# Patient Record
Sex: Male | Born: 1935 | Race: White | Hispanic: No | Marital: Married
Health system: Midwestern US, Academic
[De-identification: ages and names within clinical notes are randomized; demographics above are authoritative.]

---

## 2017-04-22 ENCOUNTER — Encounter: Admit: 2017-04-22 | Discharge: 2017-04-23 | Payer: MEDICARE

## 2017-04-22 ENCOUNTER — Encounter: Admit: 2017-04-22 | Discharge: 2017-04-22 | Payer: MEDICARE

## 2017-04-24 ENCOUNTER — Encounter: Admit: 2017-04-24 | Discharge: 2017-04-25 | Payer: MEDICARE

## 2017-04-30 ENCOUNTER — Inpatient Hospital Stay: Admit: 2017-04-30 | Discharge: 2017-05-05 | Disposition: A | Discharge status: Rehabilitation Facility | Payer: MEDICARE

## 2017-04-30 ENCOUNTER — Encounter: Admit: 2017-04-30 | Discharge: 2017-04-30 | Payer: MEDICARE

## 2017-04-30 DIAGNOSIS — R69 Illness, unspecified: ICD-10-CM

## 2017-04-30 DIAGNOSIS — I96 Gangrene, not elsewhere classified: ICD-10-CM

## 2017-04-30 DIAGNOSIS — Z9889 Other specified postprocedural states: ICD-10-CM

## 2017-04-30 DIAGNOSIS — I82409 Acute embolism and thrombosis of unspecified deep veins of unspecified lower extremity: ICD-10-CM

## 2017-04-30 DIAGNOSIS — E785 Hyperlipidemia, unspecified: Principal | ICD-10-CM

## 2017-04-30 DIAGNOSIS — I739 Peripheral vascular disease, unspecified: ICD-10-CM

## 2017-04-30 LAB — CBC AND DIFF
Lab: 0 10*3/uL (ref 0–0.20)
Lab: 0.4 10*3/uL (ref 0–0.45)
Lab: 15 10*3/uL — ABNORMAL HIGH (ref 4.5–11.0)

## 2017-04-30 LAB — COMPREHENSIVE METABOLIC PANEL
Lab: 136 MMOL/L — ABNORMAL LOW (ref 137–147)
Lab: 21 mL/min — ABNORMAL LOW (ref >60)
Lab: 4.7 MMOL/L — ABNORMAL LOW (ref 3.5–5.1)
Lab: 99 MMOL/L — ABNORMAL LOW (ref 98–110)

## 2017-04-30 LAB — POC LACTATE: Lab: 2 MMOL/L (ref 0.5–2.0)

## 2017-04-30 MED ORDER — VANCOMYCIN 1,250 MG IVPB
15 mg/kg | Freq: Once | INTRAVENOUS | 0 refills | Status: CP
Start: 2017-04-30 — End: ?
  Administered 2017-04-30 (×2): 1250 mg via INTRAVENOUS

## 2017-04-30 MED ORDER — HEPARIN (PORCINE) IN 5 % DEX 20,000 UNIT/500 ML (40 UNIT/ML) IV SOLP
0-2000 [IU]/h | INTRAVENOUS | 0 refills | Status: DC
Start: 2017-04-30 — End: 2017-05-05
  Administered 2017-05-01 – 2017-05-05 (×8): 1311 [IU]/h via INTRAVENOUS

## 2017-04-30 MED ORDER — LEVOFLOXACIN IN D5W 750 MG/150 ML IV PGBK
750 mg | Freq: Once | INTRAVENOUS | 0 refills | Status: CP
Start: 2017-04-30 — End: ?
  Administered 2017-05-01: 01:00:00 750 mg via INTRAVENOUS

## 2017-04-30 MED ORDER — CEFEPIME IN DEXTROSE,ISO-OSM 2 GRAM/100 ML IV PGBK
2 g | Freq: Two times a day (BID) | INTRAVENOUS | 0 refills | Status: DC
Start: 2017-04-30 — End: 2017-05-01

## 2017-04-30 MED ORDER — VANCOMYCIN PHARMACY TO MANAGE
1 | 0 refills | Status: DC
Start: 2017-04-30 — End: 2017-05-01

## 2017-04-30 MED ORDER — LACTATED RINGERS IV SOLP
1000 mL | INTRAVENOUS | 0 refills | Status: CP
Start: 2017-04-30 — End: ?
  Administered 2017-05-01: 03:00:00 1000 mL via INTRAVENOUS

## 2017-04-30 MED ORDER — LACTATED RINGERS IV SOLP
250 mL | INTRAVENOUS | 0 refills | Status: CP
Start: 2017-04-30 — End: ?
  Administered 2017-04-30: 23:00:00 250 mL via INTRAVENOUS

## 2017-04-30 MED ORDER — METOPROLOL SUCCINATE 50 MG PO TB24
50 mg | Freq: Every day | ORAL | 0 refills | Status: CN
Start: 2017-04-30 — End: ?

## 2017-04-30 MED ORDER — APIXABAN 5 MG PO TAB
5 mg | Freq: Two times a day (BID) | ORAL | 0 refills | Status: DC
Start: 2017-04-30 — End: 2017-05-01

## 2017-04-30 MED ORDER — VANCOMYCIN 1,250 MG IVPB
15 mg/kg | Freq: Two times a day (BID) | INTRAVENOUS | 0 refills | Status: DC
Start: 2017-04-30 — End: 2017-05-01

## 2017-04-30 MED ORDER — ATORVASTATIN 40 MG PO TAB
40 mg | Freq: Every day | ORAL | 0 refills | Status: DC
Start: 2017-04-30 — End: 2017-05-05
  Administered 2017-05-01 – 2017-05-05 (×5): 40 mg via ORAL

## 2017-04-30 MED ORDER — HEPARIN (PORCINE) BOLUS FOR CONTINUOUS INF (BAG)
20-40 [IU]/kg | INTRAVENOUS | 0 refills | Status: DC
Start: 2017-04-30 — End: 2017-05-05

## 2017-04-30 MED ORDER — CEFEPIME IN DEXTROSE,ISO-OSM 2 GRAM/100 ML IV PGBK
2 g | INTRAVENOUS | 0 refills | Status: DC
Start: 2017-04-30 — End: 2017-05-01

## 2017-04-30 MED ORDER — VANCOMYCIN RANDOM DOSING
1 | INTRAVENOUS | 0 refills | Status: DC
Start: 2017-04-30 — End: 2017-05-01

## 2017-04-30 MED ORDER — ASPIRIN 325 MG PO TAB
325 mg | Freq: Every day | ORAL | 0 refills | Status: DC
Start: 2017-04-30 — End: 2017-05-05
  Administered 2017-05-01 – 2017-05-05 (×5): 325 mg via ORAL

## 2017-04-30 MED ORDER — LACTATED RINGERS IV SOLP
1000 mL | Freq: Once | INTRAVENOUS | 0 refills | Status: CP
Start: 2017-04-30 — End: ?
  Administered 2017-05-01: 11:00:00 1000 mL via INTRAVENOUS

## 2017-04-30 MED ORDER — SIMVASTATIN 20 MG PO TAB
80 mg | Freq: Every evening | ORAL | 0 refills | Status: DC
Start: 2017-04-30 — End: 2017-05-01

## 2017-04-30 MED ORDER — HEPARIN (PORCINE) BOLUS FOR CONTINUOUS INF (BAG)
70 [IU]/kg | Freq: Once | INTRAVENOUS | 0 refills | Status: CP
Start: 2017-04-30 — End: ?

## 2017-04-30 NOTE — ED Notes
Pt to US with transport

## 2017-04-30 NOTE — ED Notes
Pt is a 83 y.o. male who presents to the ED for a CC of possible necrotic 1st and 4th toes on left foot. Pt also states that MD believes he has decreased renal function. Pt has a past medical history of DVT (deep venous thrombosis) (HCC) (12/28/2014); Dyslipidemia (12/28/2014); History of CEA (carotid endarterectomy) (12/28/2014); and PVD (peripheral vascular disease) (HCC) (12/28/2014).   Pt states that a few weeks ago he had DVT and during removal he became paralyzed from the waist down and had to go to rehab. Pt formed an ulcer on the left foot and was wearing a soft boot. Pt then states when he was doing PT today at rehab they hit his foot and it started to bleed. When RN went to stop the bleeding, they noticed the black on the foot. Pt was sent to ER for evaluation of possible amputation. Pt very tearful at bedside. Denies fevers, chills or pain.   Pt AA&Ox4; VSS; NAD; Skin pwd.  Call light within reach, bed in lowest and locked position; oriented to surroundings  Await MD eval.

## 2017-04-30 NOTE — Consults
[facility] Consult Note  04/30/2017     Patient: Justin Hunter  MRN: [id number]    Admission Date:  04/30/2017, LOS: 0 days  Admission Diagnosis: No admission diagnoses are documented for this encounter.  Reason for Consult: toe discoloration    ASSESSMENT: 83 y.o. 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 Hollie Beach  __________________________________________________________________________________    HPI: Justin Hunter is a 83 y.o. male with a history of PVD. He recently underwent a LLE bypass at OSH. Procedure was complicated by need for thrombolysis post-op. 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 He has been at rehab since that time - approx 1.5 weeks. He was sent to [facility] ED due to rising creatinine. He has had discoloration of his toes on his left foot for some time and vascular surgery was asked to evaluate. Currently denies pain.     Past Medical History:   Diagnosis Date   ??? DVT (deep venous thrombosis) (HCC) 12/28/2014   ??? Dyslipidemia 12/28/2014   ??? History of CEA (carotid endarterectomy) 12/28/2014   ??? PVD (peripheral vascular disease) (HCC) 12/28/2014     History reviewed. No pertinent surgical history.  Family History   Problem Relation Age of Onset   ??? Cirrhosis Mother    ??? Heart Attack Father      Social History     Social History   ??? Marital status: Married     Spouse name: N/A   ??? Number of children: N/A   ??? Years of education: N/A     Occupational History   ??? Not on file.     Social History Main Topics   ??? Smoking status: Former Smoker     Packs/day: 1.00     Years: 30.00     Types: Cigarettes     Quit date: 06/29/2014   ??? Smokeless tobacco: Never Used   ??? Alcohol use 1.2 oz/week 1 Glasses of wine, 1 Cans of beer per week      Comment: occasionally   ??? Drug use: No   ??? Sexual activity: Not on file     Other Topics Concern   ??? Not on file     Social History Narrative   ??? No narrative on file       Medications:  No current facility-administered medications on file prior to encounter.      Current Outpatient Prescriptions on File Prior to Encounter   Medication Sig Dispense Refill   ??? aspirin 325 mg tablet Take 325 mg by mouth daily.     ??? lisinopril (PRINIVIL; ZESTRIL) 20 mg tablet Take 20 mg by mouth daily.     ??? metFORMIN (GLUCOPHAGE) 500 mg tablet Take 500 mg by mouth twice daily with meals.     ??? metoprolol XL (TOPROL XL) 50 mg tablet Take 50 mg by mouth daily.     ??? simvastatin (ZOCOR) 80 mg tablet Take 80 mg by mouth at bedtime daily.     ??? sucralfate (CARAFATE) 1 gram tablet Take 1 g by mouth every 6 hours.     ??? warfarin (COUMADIN) 5 mg tablet Take 5 mg by mouth daily. Daily except Sunday and Tuesday take 2.5mg   Allergies:  Niacin    Vitals:  Vital Signs: Last Filed In 24 Hours Vital Signs: 24 Hour Range   BP: 104/65 (09/26 1350)  Temp: 36.4 ???C (97.5 ???F) (09/26 1350)  Pulse: 94 (09/26 1350)  Respirations: 18 PER MINUTE (09/26 1350)  SpO2: 96 % (09/26 1350)  O2 Delivery: None (Room Air) (09/26 1350)  Height: 172.7 cm (68) (09/26 1350) BP: (104)/(65)   Temp:  [36.4 ???C (97.5 ???F)]   Pulse:  [94]   Respirations:  [18 PER MINUTE]   SpO2:  [96 %]   O2 Delivery: None (Room Air)          Intake/Output:  No intake or output data in the 24 hours ending 04/30/17 1656  Physical Exam:   GEN:  Well nourished, A&O, NAD  HEENT:  EOMI, no scleral icterus  RESP:  Unlabored respirations  CV:  Regular rate  ABD: Soft, NT, ND,   EXT:    Right - biphasic DP/PT  Left - Monophasic DP/PT. Strong monophasic distal AT at ankle.   SKIN: No jaundice or pallor  NEURO: b/l LE weakness    ROS:  A complete 12 point ROS was obtained and was negative except for those listed in HPI. Lab/Radiology/Other Diagnostic Tests:  Recent Labs      04/30/17   1400   HGB  11.1*   HCT  32.6*   WBC  15.4*   PLTCT  299   NA  136*   K  4.7   CL  99   CO2  23   BUN  99*   CR  3.46*   GLU  157*   CA  9.1   ALBUMIN  3.7   TOTPROT  7.3   TOTBILI  0.8   AST  57*   ALT  40   ALKPHOS  75     Glucose: (!) 157 (04/30/17 1400)    US DOPPLER ART LE LEFT   Final Result         1.  Extensive arterial vascular disease within the left lower extremity with occluded superficial femoral, popliteal, posterior tibial and dorsalis pedis arteries.   2.  Patent graft extending from the left common femoral artery to the high calf with patent anterior tibial artery.   3.  Short segment occluded graft in the region of the mid posterior tibial artery.   4.  Minute flow within the peroneal artery with severe arterial insufficiency.          Approved by Claudina Lick, M.D. on 04/30/2017 4:26 PM      By my electronic signature, I attest that I have personally reviewed the images for this examination and formulated the interpretations and opinions expressed in this report          Finalized by Arlana Hove, M.D. on 04/30/2017 4:28 PM. Dictated by Claudina Lick, M.D. on 04/30/2017 4:09 PM.               Durwin Reges, MD  [phone]

## 2017-04-30 NOTE — ED Notes
Medical Records Authorization signed and faxed to [facility].

## 2017-05-01 LAB — PTT (APTT)
Lab: 26 s — ABNORMAL LOW (ref >60)
Lab: 90 s — ABNORMAL HIGH (ref >60)
Lab: 82 s — ABNORMAL HIGH (ref 20.0–36.0)

## 2017-05-01 LAB — GRAM STAIN

## 2017-05-01 LAB — URINALYSIS DIPSTICK
Lab: NEGATIVE U/L (ref 7–56)
Lab: NEGATIVE U/L — ABNORMAL LOW (ref 25–110)
Lab: NEGATIVE g/dL — ABNORMAL HIGH (ref 3.5–5.0)
Lab: NEGATIVE mg/dL (ref 0.3–1.2)

## 2017-05-01 LAB — URINALYSIS, MICROSCOPIC

## 2017-05-01 LAB — CULTURE-URINE W/SENSITIVITY

## 2017-05-01 LAB — SODIUM-URINE RANDOM
Lab: 58 MMOL/L
Lab: 58 MMOL/L

## 2017-05-01 LAB — SED RATE: Lab: 30 mm/h — ABNORMAL HIGH (ref 0–20)

## 2017-05-01 LAB — C REACTIVE PROTEIN (CRP): Lab: 9.4 mg/dL — ABNORMAL HIGH (ref <1.0)

## 2017-05-01 LAB — CBC AND DIFF
Lab: 8.7 K/UL — ABNORMAL LOW (ref 4.5–11.0)
Lab: 9.1 g/dL — ABNORMAL LOW (ref >60)

## 2017-05-01 LAB — OSMOLALITY-URINE RANDOM: Lab: 514 mosm/kg (ref 50–1400)

## 2017-05-01 LAB — CREATININE-URINE RANDOM: Lab: 104 mg/dL

## 2017-05-01 LAB — BASIC METABOLIC PANEL: Lab: 135 MMOL/L — ABNORMAL LOW (ref >60)

## 2017-05-01 MED ORDER — SENNOSIDES-DOCUSATE SODIUM 8.6-50 MG PO TAB
2 | Freq: Two times a day (BID) | ORAL | 0 refills | Status: DC
Start: 2017-05-01 — End: 2017-05-03
  Administered 2017-05-01: 20:00:00 2 via ORAL
  Administered 2017-05-02: 02:00:00 1 via ORAL
  Administered 2017-05-02: 15:00:00 2 via ORAL

## 2017-05-01 MED ORDER — POLYETHYLENE GLYCOL 3350 17 GRAM PO PWPK
1 | Freq: Every day | ORAL | 0 refills | Status: DC
Start: 2017-05-01 — End: 2017-05-05
  Administered 2017-05-01 – 2017-05-05 (×4): 17 g via ORAL

## 2017-05-01 MED ORDER — BISACODYL 10 MG RE SUPP
10 mg | Freq: Every day | RECTAL | 0 refills | Status: DC
Start: 2017-05-01 — End: 2017-05-03
  Administered 2017-05-01: 20:00:00 10 mg via RECTAL

## 2017-05-01 NOTE — ED Notes
BH [phone] (ready) please call Debbie at [phone]

## 2017-05-01 NOTE — Progress Notes
Pharmacy Vancomycin Note  Subjective:   Justin Hunter is a 83 y.o. male being treated for Concern for Osteomyelitis.    Objective:     Current Vancomycin Orders   Medication Dose Route Frequency    vancomycin, pharmacy to manage  1 each Service Per Pharmacy    vancomycin, random dosing  1 each Intravenous Random Dosing     Start Date of  Vancomycin therapy: 04/30/2017  Additional Abx: cefepime  Cultures:  ,  ,  ,    White Blood Cells   Date/Time Value Ref Range Status   04/30/2017 1400 15.4 (H) 4.5 - 11.0 K/UL Final     Creatinine   Date/Time Value Ref Range Status   04/30/2017 1400 3.46 (H) 0.4 - 1.24 MG/DL Final     Blood Urea Nitrogen   Date/Time Value Ref Range Status   04/30/2017 1400 99 (H) 7 - 25 MG/DL Final     Estimated CrCl: 17.8    Intake/Output Summary (Last 24 hours) at 04/30/17 2151  Last data filed at 04/30/17 1940   Gross per 24 hour   Intake              525 ml   Output                0 ml   Net              525 ml      Actual Weight:  87.4 kg (192 lb 10.9 oz)  Dosing BW:  88 kg     Assessment:   Target levels for this patient: 15-20.    Plan:   1. Patient recieved vancomycin 1250mg  IV x1 9/26 PM.  Will do random dosing given renal fxn.  2. Next scheduled level(s): day rph to determine  3. Pharmacy will continue to monitor and adjust therapy as needed.    Arneta Cliche, [facility]  04/30/2017   [phone]

## 2017-05-01 NOTE — Case Management (ED)
Case Management Admission Assessment    NAME:Justin Hunter                          MRN: [id number]             DOB:29-Dec-1932          AGE: 83 y.o.  ADMISSION DATE: 04/30/2017             DAYS ADMITTED: LOS: 1 day      Today???s Date: 05/01/2017    Source of Information: Pt and wife     Plan: ???  Justin Hunter is an 83 y.o. male with a past medical history of peripheral vascular disease status post multiple left lower extremity interventions, dry gangrene of the lower extremity left toe, spinal thoracic stroke who presents to the ED from his rehab facility after being noted to have progression of his left lower extremity toe dry gangrene with a worsening of the renal function.    Anticipate d.c back to [facility] when medically stable. SW will arrange transport back to facility     -Pt lives with wife in a single wife trailer, but was admitted from [facility]   -Before recent medical issues and placement at [facility], pt was independent in ALDs and self care tasks   -Pt has a shower seat but no other DME. [facility] will need to assist pt with obtaining appropriate DME after [facility] placement  -Pt has Medicare and BCBS. Pt does not have a perscription plan and states they pay out of pocket for medicaitons. [facility] is [facility] and states he used to get mediations from [place] but hasn't utilized the [place] for medications for awhile.     Patient Address/Phone  [address]  [place] [address]  [phone] (home)     Emergency Contact  Extended Emergency Contact Information  Primary Emergency Contact: Theodoro [place]ist [place]  Home Phone: [phone]  Relation: None  Secondary Emergency Contact: Delpizzo,Joan   [place]  Home Phone: [phone]  Relation: Spouse    Healthcare Directive  Healthcare Directive: Yes, patient has a healthcare directive  Type of Healthcare Directive: Healthcare directive, Living Will  Would patient like to fill out a (a new) Healthcare Directive?: No, patient declined      Transportation Does the patient need discharge transport arranged?: Yes  Transportation Name, Phone and Availability #1: Facility transport     Expected Discharge Date  Expected Discharge Date: 05/03/17    Living Situation Prior to Admission  ? Living Arrangements  Type of Residence: Acute care facility  Living Arrangements: Spouse/significant other  Financial risk analyst / Tub: Walk-in Shower  How many levels in the residence?: 1  Can patient live on one level if needed?: Yes  Does residence have entry and/or side stairs?: Yes (6 steps )  Assistance needed prior to admit or anticipated on discharge: Yes  Who provides assistance or could if needed?: Facility staf/wife   Are they in good health?: Unknown  Can support system provide 24/7 care if needed?: Maybe  ? Level of Function   Prior level of function: Needs assist with ADLs  Which ADLs require assistance?: All   Who assists with ADLs?: Facility staff   ? Cognitive Abilities   Cognitive Abilities: Alert and Oriented, Participates in decision making, Engages in problem solving and planning    Financial Resources  ? Coverage  Primary Insurance: Medicare  Secondary Insurance: Commercial insurance    ?  Source of Income   Source Of Income: Other retirement income, SSI  ? Financial Assistance Needed?  N/A    Psychosocial Needs  ? Mental Health  Mental Health History: No  ? Substance Use History  Substance Use History Screen: No  ? Other  N/A    Current/Previous Services  ? PCP  Dalbert Batman, [phone], [phone]  ? Pharmacy  No Pharmacies Listed  ? Durable Medical Equipment   Durable Medical Equipment at home: Shower Chair  ? Home Health  Receiving home health: In the past  Agency name: [facility]   Would patient use this agency again?: Yes  ? Hemodialysis or Peritoneal Dialysis  Undergoing hemodialysis or peritoneal dialysis: No  ? Tube/Enteral Feeds  Receive tube/enteral feeds: No  ? Infusion  Receive infusions: No  ? Private Duty  Private duty help used: No ? Home and Community Based Services  Home and community based services: No  ? Ryan White  Ryan White: No  ? Hospice  Hospice: No  ? Outpatient Therapy  PT: No  OT: No  SLP: No  ? Skilled Nursing Facility/Nursing Home  SNF: No  NH: No  ? Inpatient Rehab  IPR: Yes  When did patient receive care?: Current  Name of Facility: [facility]   Would patient return for future services?: Yes  ? Long-Term Acute Care Hospital  LTACH: No  ? Acute Hospital Stay  Acute Hospital Stay: In the past

## 2017-05-01 NOTE — Case Management (ED)
Case Management Progress Note    NAME:Justin Hunter                          MRN: [id number]              DOB:[birth year]          AGE: 83 y.o.  ADMISSION DATE: 04/30/2017             DAYS ADMITTED: LOS: 1 day      Todays Date: 05/01/2017    Plan: Anticipate d.c back to [facility] when medically stable    PT recommending inpatient setting  Wound following-recommendations:  Left toes- Dry gauze spacers between toes, Aquacel Ag loosely around 4th toes to absorb drainage, change daily. May use stretch netting to keep gauze in place   Buttocks- Criticaid barrier cream BID and PRN   Q 2 hour turning schedule, use foam wedge for support   No brief while in bed/ single chux layer     Surgery Vascular following-recommendations:  -No acute surgical intervention  -Need to obtain surgical records from OSH  -Recommend medicine admission for management of AKI  -Will follow     Interventions  ? Support      ? Info or Referral      ? Discharge Planning   SW received message from [facility] liaison updating that came came from facility and they able to accept back when medically stable   SW sent updates to [facility]   ? Medication Needs      ? Financial      ? Legal      ? Other        Disposition  ? Expected Discharge Date       ? Transportation      ? Next Level of Care (Acute Psych discharges only)      ? Discharge Disposition    Durable Medical Equipment     No service has been selected for the patient.      [facility]     No service has been selected for the patient.      [facility]     No service has been selected for the patient.      [facility]     No service has been selected for the patient.

## 2017-05-01 NOTE — Patient Education
Medication Education    Justin Hunter accepted counseling and was engaged.  he verbalized understanding.    The following medications were discussed:  Heparin drip  LR fluids  Eliquis    Where indicated, the patient was provided with additional medication and/or disease-state information, including pressure injury prevention, foley care, SCD's.  All patient questions were answered and patient acknowledged understanding of the medications, side effects and other pertinent medication information.    Follow up should occur as needed.    Continue to address: indications    Tawny Hopping, RN

## 2017-05-01 NOTE — Progress Notes
PHYSICAL THERAPY  ASSESSMENT     MOBILITY:  Mobility  Progressive Mobility Level: Sit on edge of bed  Level of Assistance: Assist X1  Assistive Device: None  Time Tolerated: 11-30 minutes  Activity Limited By: Fatigue    SUBJECTIVE:  Subjective  Significant hospital events: Justin Hunter is a 83 y.o. male with a history of PVD. He underwent a LLE bypass at OSH 9/17. Procedure was complicated by need for thrombolysis post-op. 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 He has been at rehab approx 2 days. He was sent to [facility] ED due to rising creatinine. He has had discoloration of his toes on his left foot for some time and vascular surgery was asked to evaluate.  Mental / Cognitive Status: Alert;Cooperative;Follows Commands  Persons Present: Daughter;Spouse;Nursing Staff;Provider  Pain: Patient has no complaint of pain  Comments: Pt was independent prior to surgery. Pt reports at rehab they had been transferring him via hoyer to an electric w/c and he had done some leg exercises. Pt had not been wearing a solid PRAFO since procedure.    ROM:  ROM  LE ROM WFL: Yes    STRENGTH:  Strength  R LE WNL: No  R Hip Flexion: 0/5  R Hip Extension: 0/5  R Hip Abduction: 0/5  R Hip Adduction: 0/5  R Hip External Rotation: 0/5  R Hip Internal Rotation: 0/5  R Knee Flexion: 0/5  R Knee Extension: 0/5  R Ankle Dorsiflexion: 0/5  R Ankle Inversion: 0/5  R Ankle Eversion: 0/5  R Ankle Plantarflexion: 0/5  R Great Toe Extension: 0/5  R Great Toe Flexion: 0/5  L LE WNL: No  L Hip Flexion: 2-/5  L Hip Extension: 2-/5  L Hip Abduction: 2+/5  L Hip Adduction: 2+/5  L Hip External Rotation: 2/5  L Hip Internal Rotation: 2/5  L Knee Flexion: 2-/5  L Knee Extension: 3-/5  L Ankle Dorsiflexion: 0/5  L Ankle Inversion: 0/5  L Ankle Eversion: 0/5  L Ankle Plantarflexion: 2/5  L Great Toe Extension: 0/5  L Great Toe Flexion: 0/5  L UE WNL: Yes Strength Comment: Pt has previous shoulder problems limiting ability to resist during muscle testing.    POSTURE/NEURO:  Posture / Neurological  LLE Sensation/Proprioception: Impaired Light Touch  RLE Sensation/Proprioception: Impaired Light Touch  Posture/Neuro Comments: Sensation intact throughout torso, becomes diminished at hips. Patchy sensation present throughout LLE, none on RLE. Pt reports BLE tingling following PT session.    BED MOBILITY/TRANSFERS:  Bed Mobility/Transfers  Bed Mobility: Rolling: Moderate Assist;Verbal Cues;Bed Flat;Use of Rail;Assist with B LE  Bed Mobility: Supine to Sit: Maximum Assist;Verbal Cues;Bed Flat;Use of Rail;Assist with B LE;Assist with Trunk  Bed Mobility: Sit to Supine: Maximum Assist;Verbal Cues;Bed Flat;Use of Rail;Safety Considerations;Assist with B LE;Assist with Trunk  Comments: Pt rolled to right side and sat from side lying position on R.  End Of Activity Status: In Bed;Nursing Notified;Instructed Patient to Request Assist with Mobility;Instructed Patient to Use Call Light (chair mode)    BALANCE:  Balance  Sitting Balance: Static Sitting Balance;2 UE Support;Standby Assist;Minimal Assist;Dynamic Sitting Balance;Moderate Assist    GAIT:  Gait  Comments: Not indicated at this time.    ACTIVITY/EXERCISE:  Activity / Exercise  Sit Edge Of Bed: 20 minutes  Sit Edge Of Bed Assist: Variable (progressed from ModA to SBA-CGA)  Exercise: Seated;LLE (LAQ)  Comments: Pt completed reps trunk rotation, varying hand placement, placing hands on thighs  and correcting balance, contralateral reaching, lateral leaning to forearm and recovery and other static and dynamic sitting balance activities. Pt progressed from ModA due to right and retrograde lean to close SBA.    EDUCATION:  Education  Persons Educated: Patient/Family  Patient Barriers To Learning: Decreased Hearing  Interventions: Repetition of Instructions;Louder Voice Required  Teaching Methods: Verbal Instruction Patient Response: Verbalized Understanding  Topics: Plan/Goals of PT Interventions;Mobility Progression    ASSESSMENT/PROGRESS:  Assessment/Progress  Impaired Mobility Due To: Decreased Strength;Post Surgical Changes;Impaired Balance  Assessment/Progress: Should Improve w/ Continued PT  AM-PAC 6 Clicks Basic Mobility Inpatient  Turning from your back to your side while in a flat bed without using bed rails: A lot  Moving from lying on your back to sitting on the side of a flatbed without using bedrails : A Lot  Moving to and from a bed to a chair (including a wheelchair): Total  Standing up from a chair using your arms (e.g. wheelchair, or bedside chair): Total  To walk in hospital room: Total  Climbing 3-5 steps with a railing: Total  Raw Score: 8  Standardized (T-scale) Score: 22.61  Basic Mobility CMS 0-100%: 85.35  CMS G Code Modifier for Basic Mobility: CM     G-Codes: Mobility  [id number] Current Status: 80-99% Impairment   [id number] Goal Status: 40-59% Impairment     Based on above evaluation and clinical judgment.    GOALS:  Goals  Goal Formulation: With Patient/Family  Time For Goal Achievement: 7 days  Pt Will Go Supine To/From Sit: w/ Minimal Assist  Pt Will Transfer Bed/Chair: w/ Moderate Assist  Pt Will Transfer Sit to Stand: w/ Maximum Assist  Pt Will Achieve Sitting Balance: Standby Assist  Patientt Will Sit Edge Of Bed: 11-15 Minutes, w/ Stand By Assist    PLAN:  Plan   Treatment Interventions: Mobility Training;Strengthening;Balance Activities  Plan Frequency: 5-7 Days per Week  PT Plan for Next Visit: *progress pt assist with rolling and bed mobility *sitting balance *LE strengthening *trial slide board once sitting balance improves     RECOMMENDATIONS:  PT Discharge Recommendations  PT Discharge Recommendations: Inpatient Setting;Recommend Physical Medicine and Rehabilitation Consult to address most appropriate level of rehabilitation placement  Equipment Recommendations: Too early to be determined Therapist: Trilby Leaver, PT  Date: 05/01/2017

## 2017-05-01 NOTE — Progress Notes
ORTHOTICS/PROSTHETICS  Consult Note:      NAME: Justin Hunter  ADMISSION DATE: admitted to hospital  DOB: November 08, 1932  AGE: 83 y.o.  ROOM: [id number]  DOCTOR:     Date of Order: 05/01/17  Date of Service: 05/01/17    Services referred for: Orthotic Eval and Treat: PRAFO-Anatomical concepts    Description of condition/injury, including services:Foot Drop    Size: Reg Anatomical Concepts PRAFO     Side Alternating      Measurements: Area/circumference/Diameter/Length None    Functional Goals discussed for device use:  ankle positioning to prevent plantar flexion contracture and unload calcaneus    Device is to be ordered No    Estimated date of delivery Today    Functional goals met: Yes    The patient states satisfaction with the fit/function of device: Yes    Additional supplies provided to the patient: None    Patient Education:  Written and/or verbal instruction/information provided to Patient  Information provided: device function, usage/break-in period, donning/doffing of device, care and cleaning and benefits and precautions      Patient did tolerate the procedure without incident/problem.    Follow-up scheduled: PRN    PLAN:  Order completed    Dahlia Client  05/01/2017

## 2017-05-01 NOTE — Progress Notes
Patient arrived to room # [phone]) via cart accompanied by transport. Patient transferred to the bed with assistance. Bedside safety checks completed. Initial patient assessment completed, refer to flowsheet for details. Admission skin assessment completed by: Tawny Hopping, RN; Gwenith Spitz, RN    Pressure Injury Present on Hospital Admission (within 24 hours): Yes    1. Occiput: No  2. Ear: No  3. Scapula: No  4. Spinous Process: No  5. Shoulder: No  6. Elbow: No  7. Iliac Crest: Yes  8. Sacrum/Coccyx: Yes  9. Ischial Tuberosity: No  10. Trochanter: No  11. Knee: No  12. Malleolus: No  13. Heel: Yes  14. Toes: No  15. Assessed for device associated injury Yes  16. Nursing Nutrition Assessment Completed No    See Doc Flowsheet for additional wound details.     INTERVENTIONS: Wound team consulted, patient made a Q2turn

## 2017-05-01 NOTE — Patient Education
Medication Education    Justin Hunter accepted counseling and was receptive.  he needs reinforcement.    The following medications were discussed:  Aspirin, Lipitor, Bowel regimen, Q2T, Foley care    Where indicated, the patient was provided with additional medication and/or disease-state information.  All patient questions were answered and patient acknowledged understanding of the medications, side effects and other pertinent medication information.    Follow up should occur daily.    Continue to address: indications    Robbie Lis, RN

## 2017-05-01 NOTE — Consults
Wound Ostomy Note    NAME:Delois Treto                                                                   MRN: [id number]                 DOB:05/01/1933          AGE: 83 y.o.  ADMISSION DATE: 04/30/2017             DAYS ADMITTED: LOS: 1 day      Reason for Consult/Visit: wound not pressure and pressure injury Stage II or greater    Assessment/Plan:    Active Problems:    Dyslipidemia    Hypertension    PVD (peripheral vascular disease) (HCC)    Coronary artery disease involving native coronary artery of native heart without angina pectoris        Wounds (NOT for Pressure Injuries) 04/30/17 2135 Left Toe (Comment Which One) Ulcer (not from pressure) Gangrene (Active)   04/30/17 2135 Toe (Comment Which One) great toe   Wound Orientation: Left   Wound Type: Ulcer (not from pressure)   Wound Type:: Gangrene   Wound Description (Comments):    Wound Base Assessment Black;Dry;Other (Comment) 05/01/2017 10:00 AM   Surrounding Skin Assessment Dusky 05/01/2017 10:00 AM   Wound Site Closure Open to Air 05/01/2017 10:00 AM   Wound Drainage Amount None 05/01/2017 10:00 AM   Wound Dressing Status None;Open to Air 05/01/2017  7:45 AM   Wound Dressing and / or Treatment Dry Gauze Outers 05/01/2017 10:00 AM   Number of days: 1       Wounds (NOT for Pressure Injuries) 04/30/17 2135 Left Toe (Comment Which One) Ulcer (not from pressure) (Active)   04/30/17 2135 Toe (Comment Which One) 4th toe   Wound Orientation: Left   Wound Type: Ulcer (not from pressure)   Wound Type::    Wound Description (Comments):    Wound Base Assessment Purple;Red 05/01/2017 10:00 AM   Surrounding Skin Assessment Dry;Purple;Flaky 05/01/2017  7:45 AM   Wound Site Closure None 05/01/2017  7:45 AM   Wound Drainage Amount Small 05/01/2017 10:00 AM   Wound Drainage Description Serosanguineous 05/01/2017 10:00 AM   Wound Dressing Status Intact 05/01/2017  7:45 AM   Wound Dressing and / or Treatment Aquacel AG 05/01/2017 10:00 AM   Wound Length (cm) 1.2 cm 05/01/2017 10:00 AM Wound Width (cm) 1 cm 05/01/2017 10:00 AM   Wound Depth (cm) 0.1 cm 05/01/2017 10:00 AM   Wound Volume (cm^3) 0.12 cm^3 05/01/2017 10:00 AM   Number of days: 1     Pressure Injury 04/30/17 2135 Left Deep Tissue Pressure Injury (Active)   04/30/17 2135    Wound Location: Heel   Pressure Injury Orientation: Left   Pressure Injury Stages: Deep Tissue Pressure Injury   Pressure Injury Present On Inpatient Admission: Y   If this pressure injury is suspected to be device related, please select the device::    Wound Dressing Status None;Open to Air 05/01/2017  7:45 AM   Wound Dressing and / or Treatment Boot 05/01/2017  7:45 AM   Wound Drainage Amount None 05/01/2017  7:45 AM   Wound Base Assessment Clean;Dry;Purple 05/01/2017  7:45 AM   Surrounding Skin  Assessment Dry;Intact 05/01/2017  7:45 AM      Pressure Injury 04/30/17 2135 Right Stage 2 (Active)   04/30/17 2135    Wound Location: Buttocks   Pressure Injury Orientation: Right   Pressure Injury Stages: Stage 2   Pressure Injury Present On Inpatient Admission: Y   If this pressure injury is suspected to be device related, please select the device::    Wound Dressing Status None;Open to Air 05/01/2017  7:45 AM   Wound Dressing and / or Treatment Criticaid Barrier Cream 05/01/2017  7:45 AM   Wound Drainage Description Serosanguineous 05/01/2017 10:00 AM   Wound Drainage Amount Small 05/01/2017 10:00 AM   Wound Base Assessment Moist;Pink 05/01/2017 10:00 AM   Surrounding Skin Assessment Red 05/01/2017 10:00 AM   Wound Length (cm) 2.5 cm 05/01/2017 10:00 AM   Wound Width (cm) 1.5 cm 05/01/2017 10:00 AM   Wound Depth (cm) 0.1 cm 05/01/2017 10:00 AM   Wound Volume (cm^3) 0.38 cm^3 05/01/2017 10:00 AM       Pressure Injury 04/30/17 2135 Left;Mid Stage 2 (Active)   04/30/17 2135    Wound Location: Buttocks   Pressure Injury Orientation: Left;Mid   Pressure Injury Stages: Stage 2   Pressure Injury Present On Inpatient Admission: Y If this pressure injury is suspected to be device related, please select the device::    Wound Dressing Status None;Open to Air 05/01/2017  7:45 AM   Wound Dressing and / or Treatment Criticaid Barrier Cream 05/01/2017  7:45 AM   Wound Drainage Description Serosanguineous 05/01/2017 10:00 AM   Wound Drainage Amount Scant 05/01/2017 10:00 AM   Wound Base Assessment Moist;Pink 05/01/2017 10:00 AM   Surrounding Skin Assessment Red 05/01/2017 10:00 AM   Wound Length (cm) 1 cm 05/01/2017 10:00 AM   Wound Width (cm) 1 cm 05/01/2017 10:00 AM   Wound Depth (cm) 0.1 cm 05/01/2017 10:00 AM   Wound Volume (cm^3) 0.1 cm^3 05/01/2017 10:00 AM            83 y.o. male with thoracic stroke, paraplegia, PVD w/ recent bypass and thrombolysis, dry gangrene of LLE 1st and 4th toe    Vascular has been consulted for toes on left foot ( great and 4th toes) .  No surgery planned at this time.      Buttocks is red with open areas on each buttock.  Consistant with pressure and shear    RECOMMEND:     Left toes- Dry gauze spacers between toes, Aquacel Ag loosely around 4th toes to absorb drainage, change daily.  May use stretch netting to keep gauze in place    Buttocks- Criticaid barrier cream BID and PRN    Q 2 hour turning schedule, use foam wedge for support    No brief while in bed/ single chux layer    --- Primary Team is responsible for placing wound care orders. Please place wound orders ---    Will continue to follow.    Berton Mount RN, BSN  Wound Ostomy Nursing Consult Service  [facility]  Office:  [phone]  Pager:  [phone]

## 2017-05-01 NOTE — H&P (View-Only)
Admission History and Physical Examination      Name:  Justin Hunter                                             MRN:  [id number]   Admission Date:  04/30/2017                     Assessment/Plan:    Active Problems:    Dyslipidemia    Hypertension    PVD (peripheral vascular disease) (HCC)    Coronary artery disease involving native coronary artery of native heart without angina pectoris      Justin Hunter is an 83 y.o. male with a past medical history of peripheral vascular disease status post multiple left lower extremity interventions, dry gangrene of the lower extremity left toe, spinal thoracic stroke who presents to the ED from his rehab facility after being noted to have progression of his left lower extremity toe dry gangrene with a worsening of the renal function.    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  -Denies any fever/chills, patient is very stable on admission  -Given the patient's stable condition without any fevers with only mild leukocytosis of 15.4, will hold off on initiating antibiotic therapy at this time.  PLAN:  > Blood cultures drawn  > Wound cultures ordered  > Patient was seen by vascular surgery in the ED: No acute surgical intervention was warranted.    > Low threshold to start patient on vancomycin plus cefepime if the patient has fevers overnight  > Will obtain the recent MRI from the outside hospital in the a.m.  > Vasc surg will follow      Worsening AKI:  - Creatinine on admission 3.46 with a BUN of 99  - BUN to creatinine ratio of 28.6  - Cr 3.29 at the rehab facility prior to coming in. Baseline unknown but had Cr of 2.26 on 9/22 trending upwards from 1.82 on 9/18 as per outside records  - Patient denies any history of hematuria or other urinary symptoms  - Patient has a chronic Foley is in place  - Urinalysis was unremarkable for infection on admission  - Physical exam was positive for dry mucous membranes  - Might be due to poor volume status versus Foleys impairment versus other causes  PLAN:  > Bladder scan to check for retaining and possible obstructive etiology  > Fluid therapy with 1 L bolus Ringer's lactate followed by maintenance fluid of 1 L ordered   > Urine lytes ordered  > Urine cultures obtained  > Then consider renal ultrasound in the morning if patient's creatinine continues to worsen or does not show improvement  > Hold PTA lisinopril, apixaban        Peripheral vascular disease status post multiple interventions  - Left lower extremity bypass graft thrombosis status post angiogram with TPA on 9/17  - LLE arterial bypass initially done 6-7 yrs ago. Found to have occlusion of the graft of the graft s/p revision of the left lower extremity bypass  graft with thrombectomy and popliteal artery endarterectomy on January 14, 2017.    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 Graft extending from LCFA to high calf with patent ant tibial artery. Short segment occluded graft in mid post tibial artery region  PLAN:  > Continue PTA aspirin, atorvastatin  > Hold PTA apixaban in light of impaired renal function  > Patient started on heparin drip in place of the apixaban.  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  All blood pressure medications for now      Hyperlipidemia:  > Continue PTA atorvastatin 40 mg        Fluids: LR 125 ml infusion  PPX: heparin drip  Dispo: Admit to Med 1  Lytes: replace as needed  Code: [id number]  __________________________________________________________________________________  Primary Care Physician: Dalbert Batman  PCP Unknown    Chief Complaint:  Progression of L toe dry gangrene with Increase in Cr at the rehab facility    History of Present Illness: Free Justin Hunter is an 83 y.o. male with a past medical history of peripheral vascular disease status post multiple left lower extremity interventions, dry gangrene of the lower extremity left toe, spinal thoracic stroke, hypertension, CAD status post stenting and PTCA, diabetes mellitus type 2, CEA, hyperlipidemia who presents to the ED from his rehab facility after being noted to have progression of his left lower extremity toe dry gangrene with a worsening of the renal function demonstrated by the increase in the creatinine to 3.29.  The patient has a history of left femoral to posterior tibial bypass with PTFE and has suffered multiple episodes of thrombosis requiring intervention.  He underwent revision of the left lower extremity bypass graft with thrombectomy and popliteal artery endarterectomy on January 14, 2017.  On September 14 he was admitted to an OSH when an ultrasound revealed an occlusion in the graft.  It was decided to perform an angiogram with TPA done on 17th.  Post surgery he developed spinal thoracic infarct status post paraplegia.  He was discharged from the hospital to a rehab facility.  At that facility he was doing fine but it was noticed that his dry gangrene on the left toe was progressing and his creatinine bumped to above 3 which was concerning for AKI and he was referred to the [facility] hospital.  The patient otherwise denies any fevers, chills, flank pain, cough, chest pain, back pain, decrease in the urinary output or blood in his Foley's.  The patient denies any history of CKD.  He has a neurogenic bladder but questionable control over bowel as per the family.  He did have a large uncontrolled bowel movement yesterday with aggressive laxative therapy after being constipated without any bowel movement for the previous 10 days.   The patient also has diabetes mellitus type 2 but does not report taking any medication for his diabetes.    The patient has sensory impairment bilaterally in the lower extremities.  He is able to very slightly move his left leg with gravity.  He is not able  to move his right leg.        Past Medical History:   Diagnosis Date   ??? DVT (deep venous thrombosis) (HCC) 12/28/2014   ??? Dyslipidemia 12/28/2014   ??? History of CEA (carotid endarterectomy) 12/28/2014   ??? PVD (peripheral vascular disease) (HCC) 12/28/2014     History reviewed. No pertinent surgical history.  Family history reviewed; non-contributory  Social History     Social History   ??? Marital status: Married     Spouse name: N/A   ??? Number of children: N/A   ??? Years of education: N/A     Social History Main Topics   ??? Smoking status: Former Smoker     Packs/day: 1.00     Years: 30.00     Types: Cigarettes     Quit date: 06/29/2014   ??? Smokeless tobacco: Never Used   ??? Alcohol use 1.2 oz/week     1 Glasses of wine, 1 Cans of beer per week      Comment: occasionally   ??? Drug use: No   ??? Sexual activity: Not on file     Other Topics Concern   ??? Not on file     Social History Narrative   ??? No narrative on file      Immunizations (includes history and patient reported):   There is no immunization history on file for this patient.        Allergies:  Niacin    Medications:  Prescriptions Prior to Admission   Medication Sig   ??? aspirin 325 mg tablet Take 325 mg by mouth daily.   ??? lisinopril (PRINIVIL; ZESTRIL) 20 mg tablet Take 20 mg by mouth daily. ??? metFORMIN (GLUCOPHAGE) 500 mg tablet Take 500 mg by mouth twice daily with meals.   ??? metoprolol XL (TOPROL XL) 50 mg tablet Take 50 mg by mouth daily.   ??? simvastatin (ZOCOR) 80 mg tablet Take 80 mg by mouth at bedtime daily.   ??? sucralfate (CARAFATE) 1 gram tablet Take 1 g by mouth every 6 hours.   ??? warfarin (COUMADIN) 5 mg tablet Take 5 mg by mouth daily. Daily except Sunday and Tuesday take 2.5mg      Review of Systems:  A 10 point review of systems was negative except for: Paraplegia, bilateral lower extremity sensory impairment.    Physical Exam:  Vital Signs: Last Filed In 24 Hours Vital Signs: 24 Hour Range   BP: 113/51 (09/26 2140)  Temp: 36.4 ???C (97.5 ???F) (09/26 2140)  Pulse: 91 (09/26 2140)  Respirations: 21 PER MINUTE (09/26 2140)  SpO2: 99 % (09/26 2140)  O2 Delivery: None (Room Air) (09/26 2140)  SpO2 Pulse: 89 (09/26 2030)  Height: 172.7 cm (68) (09/26 2140) BP: (104-138)/(51-97)   Temp:  [36.4 ???C (97.5 ???F)]   Pulse:  [89-98]   Respirations:  [14 PER MINUTE-23 PER MINUTE]   SpO2:  [96 %-99 %]   O2 Delivery: None (Room Air)          General:  Alert, cooperative, no distress, lying in bed, has a Foley is in place, paraplegic   head:  Normocephalic, without obvious abnormality, atraumatic  Eyes:  Conjunctivae/corneas clear.  PERRL, EOMs intact.  Throat:  Lips, mucosa and tongue dry on exam.  Lungs:  Clear to auscultation bilaterally  Chest wall:  No tenderness or deformity.  Heart:    Regular rate and rhythm, S1, S2 normal, no murmur, click rub or gallop  Extremities: Lower extremity power  exam: Left extremity 1 out of 5, right extremity 0 out of 5 .  Lower extremity sensory exam: Bilateral impairment to touch   Lower extremities reflexes: Absent  Left lower extremity great toe: Blackish discoloration consistent with dry gangrene.  No purulent fluid or signs of active cellulitis.  Upper extremity power and sensory exam normal.  skin:   Skin color, texture, turgor normal.  No rashes or lesions Neurologic:  CNII - XII intact.  Reflexes, power and sensory exam as above.    Lab/Radiology/Other Diagnostic Tests:  24-hour labs:    Results for orders placed or performed during the hospital encounter of 04/30/17 (from the past 24 hour(s))   CBC AND DIFF    Collection Time: 04/30/17  2:00 PM   Result Value Ref Range    White Blood Cells 15.4 (H) 4.5 - 11.0 K/UL    RBC 3.45 (L) 4.4 - 5.5 M/UL    Hemoglobin 11.1 (L) 13.5 - 16.5 GM/DL    Hematocrit 16.1 (L) 40 - 50 %    MCV 94.5 80 - 100 FL    MCH 32.1 26 - 34 PG    MCHC 33.9 32.0 - 36.0 G/DL    RDW 09.6 11 - 15 %    Platelet Count 299 150 - 400 K/UL    MPV 8.7 7 - 11 FL    Neutrophils 87 (H) 41 - 77 %    Lymphocytes 7 (L) 24 - 44 %    Monocytes 4 4 - 12 %    Eosinophils 2 0 - 5 %    Basophils 0 0 - 2 %    Absolute Neutrophil Count 13.40 (H) 1.8 - 7.0 K/UL    Absolute Lymph Count 1.00 1.0 - 4.8 K/UL    Absolute Monocyte Count 0.60 0 - 0.80 K/UL    Absolute Eosinophil Count 0.40 0 - 0.45 K/UL    Absolute Basophil Count 0.00 0 - 0.20 K/UL   COMPREHENSIVE METABOLIC PANEL    Collection Time: 04/30/17  2:00 PM   Result Value Ref Range    Sodium 136 (L) 137 - 147 MMOL/L    Potassium 4.7 3.5 - 5.1 MMOL/L    Chloride 99 98 - 110 MMOL/L    Glucose 157 (H) 70 - 100 MG/DL    Blood Urea Nitrogen 99 (H) 7 - 25 MG/DL    Creatinine 0.45 (H) 0.4 - 1.24 MG/DL    Calcium 9.1 8.5 - 40.9 MG/DL    Total Protein 7.3 6.0 - 8.0 G/DL    Total Bilirubin 0.8 0.3 - 1.2 MG/DL    Albumin 3.7 3.5 - 5.0 G/DL    Alk Phosphatase 75 25 - 110 U/L    AST (SGOT) 57 (H) 7 - 40 U/L    CO2 23 21 - 30 MMOL/L    ALT (SGPT) 40 7 - 56 U/L    Anion Gap 14 (H) 3 - 12    eGFR Non African American 17 (L) >60 mL/min    eGFR African American 21 (L) >60 mL/min   POC LACTATE    Collection Time: 04/30/17  2:03 PM   Result Value Ref Range    LACTIC ACID POC 2.0 0.5 - 2.0 MMOL/L   URINALYSIS DIPSTICK    Collection Time: 04/30/17  8:56 PM   Result Value Ref Range    Color,UA YELLOW     Turbidity,UA 1+ (A) CLEAR-CLEAR Specific Gravity-Urine 1.016 1.003 - 1.035    pH,UA 5.0 5.0 - 8.0  Protein,UA 1+ (A) NEG-NEG    Glucose,UA NEG NEG-NEG    Ketones,UA NEG NEG-NEG    Bilirubin,UA NEG NEG-NEG    Blood,UA 2+ (A) NEG-NEG    Urobilinogen,UA NORMAL NORM-NORMAL    Nitrite,UA NEG NEG-NEG    Leukocytes,UA TRACE (A) NEG-NEG    Urine Ascorbic Acid, UA NEG NEG-NEG   URINALYSIS, MICROSCOPIC    Collection Time: 04/30/17  8:56 PM   Result Value Ref Range    WBCs,UA 0-2 0 - 2 /HPF    RBCs,UA 2-10 0 - 3 /HPF    MucousUA TRACE     Amorphous Sedimate,UA MODERATE    OSMOLALITY-URINE RANDOM    Collection Time: 04/30/17  8:56 PM   Result Value Ref Range    Osmolality-Urine 514 50 - 1,400 MOS/KG   SODIUM-URINE RANDOM    Collection Time: 04/30/17  8:56 PM   Result Value Ref Range    Sodium, Random 58 MMOL/L   CREATININE-URINE RANDOM    Collection Time: 04/30/17 10:15 PM   Result Value Ref Range    Creatinine, Random 104 MG/DL   SODIUM-URINE RANDOM    Collection Time: 04/30/17 10:15 PM   Result Value Ref Range    Sodium, Random 58 MMOL/L     Glucose: (!) 157 (04/30/17 1400)  Pertinent radiology reviewed.    Echo Allsbrook A. Tavarus Poteete, MBBS  Pager [phone]

## 2017-05-01 NOTE — Consults
*Primary team to place all orders*    Reason for Consult: Pt with new spinal cord injury     Communication: Pt, pt wife and niece, bedside RN and Dr.Lauer    Patient History:  Pt with history of PVD with reported thrombectomy and complication with spinal thoracic stroke approx within the last two weeks. Pt received care at [facility] in [place], [place] and was transferred to [facility] for Rehab for the past two days prior to admission.      Summary:     Introduced self and role of the spinal cord injury RN to patient and pt's family. Pt and family able to discuss recent history. Pt and family not educated at this time on spinal cord injury. Have not heard the terms neurogenic bowel, bladder or skin.      Spine:   Sensory exam:             Light touch       Left Right   C6 2 2   C7 2 2   C8 2 2   T4 2 2   T5 2 2   T6 2 2   T7 2 2   T8 2 2   T9 2 2   T10 2 2   T11 2 1   T12 2 0   L1 0    L2       Motor:              Left    Right   T1     L2 2 0       Voluntary anal contraction (Yes/No): Pt unsure. Suggest rectal exam be performed.   Any anal sensation (Yes/No): Pt unsure.      Neurogenic Bowel:  Pt and wife report that pt had 10 days prior and during rehab stay where pt did not have a BM. Pt reports that he was not able to control that BM. Would suggest rectal exam be performed to assess for voluntary anal contraction and anal sensation. If pt has decreased sensation and control and also has incontinence with bowel, pt likely to have neurogenic bowel. Suggest consult to Rehab Medicine for guidance for diagnosis if needed and management. Please also see the [facility] protocol Bowel program for the patient with a spinal cord injury. Prior to injury, pt with daily bowel movements typically in the AM after morning coffee. Discussed with pt and family what a bowel program is if pt is deemed to have neurogenic bowel. Suggest to remove brief due to pt stating he cannot feel if it is moist which can lead to increased risk of pressure sores. Suggest to use chux for any incontinence.     Neurogenic Bladder: pt with indwelling catheter in place. Pt states it has been in place since surgery and has not been removed for any assessment of his bladder. Pt unsure if he would be able to control his bladder to urinate or not. Suggest that if appropriate with patient's recent kidney issues, to remove foley and assess if pt is able to void and if pt is able to empty bladder completely. Suggest Rehab MD consult for assistance with management if desired.     Neurogenic Skin:  Pt is at increased risk for pressure sores. Please perform skin checks at minimum Qshift and turn Q2. Educated on importance of turning and monitoring skin for any signs of breakdown. Pt with outside facility boot to left foot. Per RN, PRAFO ordered to  be alternated. Would suggest foam heel offloader to right foot when PRAFO not used. Would okay PRAFO with vascular prior to use on left foot due to wound.     Spasticity: Not assessed.     Anticipated Discharge: TBD. Pt to possibly return to [facility] per notes.       Please call with any questions or concerns.     Finnley Larusso ([place]) Zeynep Fantroy  Spinal Cord Injury Program Coordinator  [phone]  Pager [phone]

## 2017-05-02 LAB — CBC AND DIFF
Lab: 9 K/UL — ABNORMAL LOW (ref >60)
Lab: 9.5 g/dL — ABNORMAL LOW (ref 13.5–16.5)

## 2017-05-02 LAB — BASIC METABOLIC PANEL: Lab: 135 MMOL/L — ABNORMAL LOW (ref 137–147)

## 2017-05-02 LAB — PTT (APTT): Lab: 83 s — ABNORMAL HIGH (ref 20.0–36.0)

## 2017-05-02 MED ORDER — SODIUM CHLORIDE 0.9 % IV SOLP
1000 mL | Freq: Once | INTRAVENOUS | 0 refills | Status: CP
Start: 2017-05-02 — End: ?
  Administered 2017-05-03: 02:00:00 1000 mL via INTRAVENOUS

## 2017-05-02 MED ORDER — METOPROLOL SUCCINATE 25 MG PO TB24
50 mg | Freq: Every day | ORAL | 0 refills | Status: DC
Start: 2017-05-02 — End: 2017-05-05
  Administered 2017-05-02 – 2017-05-05 (×4): 50 mg via ORAL

## 2017-05-02 MED ORDER — LACTATED RINGERS IV SOLP
1000 mL | Freq: Once | INTRAVENOUS | 0 refills | Status: CP
Start: 2017-05-02 — End: ?
  Administered 2017-05-02: 16:00:00 1000 mL via INTRAVENOUS

## 2017-05-02 MED ORDER — ZINC SULFATE 220 (50) MG PO CAP
220 mg | Freq: Every day | ORAL | 0 refills | Status: DC
Start: 2017-05-02 — End: 2017-05-05
  Administered 2017-05-02 – 2017-05-05 (×4): 220 mg via ORAL

## 2017-05-02 NOTE — Consults
Physical Medicine & Rehabilitation Consult Note       Date of Service:  05/02/2017  Justin Hunter is a 83 y.o. male.     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    Impairments: neurogenic bladder, neurogenic bowel, paraplegia, sensory loss and weakness  Activity Limitations:  bathing, dressing - lower, toileting, bladder control, bowel control, transfers, ambulation, wheelchair and stairs     Impaired gait/mobility:  PTA pt was dependent at community level with assistive device  Currently requiring maxA  Will benefit from continued work with PT to address mobility deficits     Impaired ADL:  PTA pt was dependent requiring hoyer lift for transfers  Currently requiring SBA-totalA  Will benefit from ongoing OT to address functional deficits    Neurogenic /Bowel:   Last BM documented 9/28. Recommend bowel regimen with Senokot 2 tabs QHS and Colace 100-200mg  daily  +/- Miralax daily prn.  If w/o consistent BM on above regimen, consider starting a formal bowel program with a daily suppository followed with digital stimulation with a goal of a BM q24-48hr.      Neurogenic Bladder:   Agree with foley catheter for now given limited mobility status. Once removed, consider voiding trial approximately q4hrs WHILE AWAKE, perform BVIs (bladder scan) if unable to void or PVRs if able to void, and perform ISC (intermittent straight catheterization) for volumes > . Would continue until pt has 3 consecutive volumes <153mL to ensure complete emptying and avoidance of urologic complications.    Spasticity:  Patient is without spasticity at time of interview and examination, but is at risk given injury. If there is noted increase in spasms or tone affecting ROM, mobility, sleep, with associated increased pain from spasms. Please page PM&R consult pager for assistance with further management.    Skin/MSK:   Given relative immobility and/or risk for contractures and skin breakdown, recommend HOB > 30 degrees to reduce shearing, turning q2 hours while supine in bed, pressure relief q74mins in seated position, PRAFOs for pressure relief and to prevent contractures.  Would encourage use of a pillow under hemiplegic arm to prevent shoulder subluxation, PROM as tolerated.  Could also consider SSRI for motor recovery post stroke if deemed appropriate by primary team.     Thank you for this consultation.  Please call our consult pager with questions or concerns.     Justin Perone, DO     Rehab Consult Pager:  [phone]    History of Present Illness     83 year old male with history of hypertension, diabetes, and peripheral artery disease for which she has had 2 left femoral popliteal bypasses.  Moreover, remote history of carotid endarterectomy.  All this procedures have been done at [facility] in [place] by Dr. Thamas Hunter.  Presented to the [facility] ED on September 26 for progression of chronic left lower extremity toe dry gangrene with subsequent worsening of renal function.  Per chart review he previously had left toe ischemia for which she had thrombolysis of his left leg bypass.  Many hours into the thrombolytic infusion he started having right leg tingling and numbness and eventually weakness.  Imaging without intracranial hemorrhage.  However, there were changes on MRI of the spine that reportedly were thought to be due to ischemia but may have been due to some localized intracordal hemorrhage.  Evaluated by neurosurgery at that point time without intervention.  He was discharged and eventually transferred to rehabilitation at [facility].  On September 20 his left leg bypass graft reoccluded and required surgical thrombectomy and revision.  Vascular surgery was consulted on admission with plan to obtain outside imaging and evaluate for further potential surgical intervention.  Medicine team currently in treatment of AK I on CKD.    Past Medical History:   Diagnosis Date   ??? DVT (deep venous thrombosis) (HCC) 12/28/2014   ??? Dyslipidemia 12/28/2014   ??? History of CEA (carotid endarterectomy) 12/28/2014   ??? PVD (peripheral vascular disease) (HCC) 12/28/2014        History reviewed. No pertinent surgical history.     Social History     Social History   ??? Marital status: Married     Spouse name: N/A   ??? Number of children: N/A   ??? Years of education: N/A     Occupational History   ??? Not on file.     Social History Main Topics   ??? Smoking status: Former Smoker     Packs/day: 1.00     Years: 30.00     Types: Cigarettes     Quit date: 06/29/2014   ??? Smokeless tobacco: Never Used   ??? Alcohol use 1.2 oz/week     1 Glasses of wine, 1 Cans of beer per week      Comment: occasionally   ??? Drug use: No   ??? Sexual activity: Not on file     Other Topics Concern   ??? Not on file     Social History Narrative   ??? No narrative on file     Family History   Problem Relation Age of Onset   ??? Cirrhosis Mother    ??? Heart Attack Father      Family history: Non-contributory     Scheduled Meds:  aspirin tablet 325 mg 325 mg Oral QDAY   atorvastatin (LIPITOR) tablet 40 mg 40 mg Oral QDAY   bisacodyl (DULCOLAX) rectal suppository 10 mg 10 mg Rectal QDAY heparin (porcine) BOLUS for continuous inf (bag) [phone] Units 20-40 Units/kg Intravenous As Prescribed   lactated ringers infusion 1,000 mL Intravenous ONCE   metoprolol XL (TOPROL XL) tablet 50 mg 50 mg Oral QDAY   polyethylene glycol 3350 (MIRALAX) packet 17 g 1 packet Oral QDAY   senna/docusate (SENOKOT-S) tablet 2 tablet 2 tablet Oral BID   Continuous Infusions:  ??? heparin (porcine) 20,000 units/D5W 500 mL infusion (std conc)(premade) 1,311 Units/hr (05/02/17 0716)     PRN and Respiratory Meds:       Allergies   Allergen Reactions   ??? Niacin RASH       Prior Level of Function     Self-Care/ADLs:  Dependent  Mobility:  required hoyer device for transfers     Home Environment:  No Data Recorded  No Data Recorded  Type of Home: Mobile Home (6 steps into home) (05/02/2017 10:00 AM)  No Data Recorded  No Data Recorded  Comments: Pt was independent prior to surgery. Pt reports at rehab they had been transferring him via hoyer to an electric w/c and he had done some leg exercises. Pt had not been wearing a solid PRAFO since procedure. (05/01/2017  9:00 AM)  No Data Recorded      Current Level Of Function:   PT Gait:       Bed Mobility/Transfers  Bed Mobility: Rolling: Moderate Assist, Verbal Cues, Bed Flat, Use of Rail, Assist with B LE  Bed Mobility: Supine to Sit: Maximum Assist, Verbal Cues, Bed Flat, Use of Rail, Assist with B LE, Assist with Trunk  Bed Mobility: Sit to Supine: Maximum Assist, Verbal Cues, Bed Flat, Use of Rail, Safety Considerations, Assist with B LE, Assist with Trunk  Comments: Pt rolled to right side and sat from side lying position on R.  End Of Activity Status: In Bed, Nursing Notified, Instructed Patient to Request Assist with Mobility, Instructed Patient to Use Call Light (chair mode)   OT ADL's  Where Assessed: Chair  Eating Assist: Stand By Assist  Grooming Assist: Stand By Assist  LE Dressing Assist: Total Assist  Toileting Assist: Total Assist Functional Transfer Assist: Total Assist (has been using the hoyer lift to a chair)  Comment: pt up in chair with family in the room. pts wife assisting to order pts breakfast.  pt with good hand to mouth to perform grooming/feeding with set up.  pt wanting to wait to get breakfast first before brushing teeth. pt able to demo ability to lean in chair and almost reach his backside.  Educated with progression of LE adls in bed to allow rolling to pull up his pants. pt currently with a catheter in place.  educated with need to continue to push UE strengthening to help with transfers; pt and family verbalized understanding   SLP COGNITIVE EVALUATION SUMMARY     PRAGMATICS:    BEHAVIOR:    AUDITORY COMPREHENSION:      ORIENTATION:    AUDITORY ATTENTION/WORKING MEMORY:    AUDITORY MEMORY/SUSTAINED ATTENTION:    NEW LEARNING:    SEQUENCING/ORGANIZATION:    PROBLEM SOLVING:    REASONING:      MATH/MONEY SKILLS:      VISUAL PERCEPTUAL:    SWALLOW EVALUATION SUMMARY                       Review of Systems     A 14 point review of systems was negative except for: that noted in the HPI    Physical Exam     BP: 152/77 (09/28 0709)  Temp: 36.4 ???C (97.6 ???F) (09/28 1610)  Pulse: 76 (09/28 0709)  Respirations: 16 PER MINUTE (09/28 0709)  SpO2: 99 % (09/28 0709)  O2 Delivery: None (Room Air) (09/28 0709)  Body mass index is 29.3 kg/m???.     Gen: awake, alert, NAD, supine in bed with 3 family members present  HEENT: NCAT, EOMI, MMM  Neck: Supple and symmetric  Heart: Extremities are well perfused  Lungs: respirations even and non-labored  Abdomen: Soft, non-distended  Psych: pleasant mood/appropriate affect  Ext: No c/c/e; PRAFO on b/l, LLE in bandage   MS:     Root Right Left   Elbow  Flexion C5 5 5   Wrist Extension C6 5 5   Elbow Extension C7 5 5   Finger Flexion C8 5 5   Finger Abduction T1 5 5   Hip Flexion L2 0 1   Knee Extension L3 0 3   Dorsiflexion L4 0 1   EHL Extension  L5 0 0   Plantarflexion S1 0 0     Neuro: Sensory to LT and PP is intact to T10 on the R. With diminished scoring from T11-L2, then absent distally, until diminisehd on S4-5  Sensory to LT and PP is intact to T9 on the L. With diminished scoring from T10-L2, then absent disatlly, until diminished on S4-5  + Deep Anal Pressure  No clonus. No tone of BLE.      Intake/Output Summary (Last 24 hours) at 05/02/17 1044  Last data filed at 05/02/17 0923   Gross per 24 hour   Intake             1080 ml   Output             2700 ml   Net            -1620 ml        Hematology:  Lab Results   Component Value Date    HGB 9.5 05/02/2017    HCT 27.0 05/02/2017    PLTCT 260 05/02/2017    WBC 9.0 05/02/2017    NEUT 66 05/02/2017    ANC 6.00 05/02/2017    ALC 1.50 05/02/2017    MONA 8 05/02/2017    AMC 0.70 05/02/2017    ABC 0.10 05/02/2017    MCV 93.1 05/02/2017    MCHC 35.1 05/02/2017    MPV 8.5 05/02/2017    RDW 12.8 05/02/2017   , Coagulation:    Lab Results   Component Value Date    PTT 83.2 05/02/2017    and General Chemistry:  Lab Results   Component Value Date    NA 135 05/02/2017    K 4.5 05/02/2017    CL 103 05/02/2017    GAP 8 05/02/2017    BUN 80 05/02/2017    CR 2.74 05/02/2017    GLU 144 05/02/2017    CA 8.1 05/02/2017    ALBUMIN 3.7 04/30/2017    TOTBILI 0.8 04/30/2017        Radiology:    9/26 US Doppler LE Arterial  1. ???Extensive arterial vascular disease within the left lower extremity   with occluded superficial femoral, popliteal, posterior tibial and   dorsalis pedis arteries.  2. ???Patent graft extending from the left common femoral artery to the high   calf with patent anterior tibial artery.  3. ???Short segment occluded graft in the region of the mid posterior tibial   artery.  4. ???Minute flow within the peroneal artery with severe arterial   insufficiency.    Justin Perone, DO  Rehab Consult Pager:  [phone]

## 2017-05-02 NOTE — Progress Notes
General Progress Note    Name:  Justin Hunter   Today's Date:  05/01/2017  Admission Date: 04/30/2017  LOS: 1 day                     Assessment/Plan:    Active Problems:    Dyslipidemia    Hypertension    PVD (peripheral vascular disease) (HCC)    Coronary artery disease involving native coronary artery of native heart without angina pectoris    Necrosis of toe (HCC)    Justin Hunter is an 83 y.o. male with a past medical history of peripheral vascular disease status post multiple left lower extremity interventions, dry gangrene of the lower extremity left toe, spinal thoracic stroke who presents to the ED from his rehab facility after being noted to have progression of his left lower extremity toe dry gangrene with a worsening of the renal function.  ???  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-progressing  - No signs of cellulitis, purulence on exam  -Patient has an extensive history of peripheral vascular disease status post multiple interventions with the last one done on 9/17: Angiogram with TPI  -Patient had recent MRI done a few days ago at [facility] (requested to be uploaded to PACS)  -wbc 15.4 on admit --> 9.1  -Denies any fever/chills, patient is stable on admission  -ESR 30, CRP: 9.48  PLAN:  > Continue to monitor off abx therapy  > Blood cultures drawn  > Wound cultures ordered; Gram stain showing no neutrophils and no organism  > Patient was seen by vascular surgery in the ED: No acute surgical intervention was warranted--will need f/u on 9/28 to determine if any surgical plan   > F/u MRI (requested)  ???  AKI   - Creatinine on admission 3.46 with a BUN of 99  - BUN to creatinine ratio of 28.6; FeNa: 1.2%  - Cr 3.29 at the rehab facility prior to coming in. Baseline unknown but had Cr of 2.26 on 9/22 trending upwards from 1.82 on 9/18 as per outside records  - Patient denies any history of hematuria or other urinary symptoms - Patient has a chronic Foley is in place; bedside bladder scan on admit with only 10ml urine  - Urinalysis was unremarkable for infection on admission; urine culture 9/27: negative  - Physical exam was positive for dry mucous membranes  - Might be due to poor volume status versus Foleys impairment versus other causes  PLAN:  > Improvement in AKI with IVF (s/p3 L); hold on additional fluids at this time.   >With improvement in renal function overnight, will hold on renal u/s  > Continue to hold PTA lisinopril, apixaban    Peripheral vascular disease status post multiple interventions  - Left lower extremity bypass graft thrombosis status post angiogram with TPA on 9/17  - LLE arterial bypass initially done 6-7 yrs ago. Found to have occlusion of the graft of the graft s/p revision of the left lower extremity bypass graft with thrombectomy and popliteal artery endarterectomy on January 14, 2017.    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 Graft extending from LCFA to high calf with patent ant tibial artery. Short segment occluded graft in mid post tibial artery region  PLAN:  > Continue PTA aspirin, atorvastatin  > Hold  PTA apixaban in light of impaired renal function  > Patient started on heparin drip in place of the apixaban.  > Vascular Surgery following  ???  Paraplegia secondary to thoracic spinal infarct after left lower extremity thrombus intervention on 9/17  PLAN:  > PT/OT consult placed; ordered PRAFO boot  > Spinal cord injury nurse consulted  > Rehab medicine consulted  > Will need to assess rectal tone; consider voiding trial with removal of Foley cath on 9/28  > Bowel and bladder regimen per SCI nurse.   ???  Hypertension  - PTA: Metoprolol 50 mg, lisinopril 10 mg  PLAN: > Patient normotensive in the ED.  Hold blood pressure medications for now  ???  Hyperlipidemia:  > Continue PTA atorvastatin 40 mg  ???  ???  ???  Fluids: no additional IV fluids at this time.   PPX: heparin drip  Dispo: Admit to Med 1  Lytes: replace as needed  Code: [id number]    Discussed with Dr. Darleene Cleaver.     Jorje Guild, MD  ________________________________________________________________________    Subjective  Justin Hunter is a 83 y.o. male.  Patient without acute complaints this morning. He and family with many questions regarding plan and potential for recovery with recent spinal thoracic stroke. He was seen sitting up at EOB with therapy's assistant. Denies fever, chills. No pain in R LE or toes with necrosis.     Medications  Scheduled Meds:  aspirin tablet 325 mg 325 mg Oral QDAY   atorvastatin (LIPITOR) tablet 40 mg 40 mg Oral QDAY   bisacodyl (DULCOLAX) rectal suppository 10 mg 10 mg Rectal QDAY   heparin (porcine) BOLUS for continuous inf (bag) [phone] Units 20-40 Units/kg Intravenous As Prescribed   polyethylene glycol 3350 (MIRALAX) packet 17 g 1 packet Oral QDAY   senna/docusate (SENOKOT-S) tablet 2 tablet 2 tablet Oral BID   Continuous Infusions:  ??? heparin (porcine) 20,000 units/D5W 500 mL infusion (std conc)(premade) 1,311 Units/hr (05/01/17 1942)     PRN and Respiratory Meds:      Review of Systems:  A 14 point review of systems was negative except for: Neurological: positive for paresthesia and weakness    Objective:                          Vital Signs: Last Filed                 Vital Signs: 24 Hour Range   BP: 137/50 (09/27 1947)  Temp: 36.6 ???C (97.8 ???F) (09/27 1947)  Pulse: 95 (09/27 1947)  Respirations: 18 PER MINUTE (09/27 1947)  SpO2: 94 % (09/27 1947)  O2 Delivery: None (Room Air) (09/27 1947)  Height: 172.7 cm (68) (09/26 2140) BP: (113-137)/(50-63)   Temp:  [36.4 ???C (97.5 ???F)-36.8 ???C (98.2 ???F)]   Pulse:  [77-95]   Respirations:  [18 PER MINUTE-21 PER MINUTE]   SpO2:  [94 %-99 %] O2 Delivery: None (Room Air)     Vitals:    04/30/17 1350 04/30/17 2140   Weight: 88.5 kg (195 lb) 87.4 kg (192 lb 10.9 oz)       Intake/Output Summary:  (Last 24 hours)    Intake/Output Summary (Last 24 hours) at 05/01/17 2134  Last data filed at 05/01/17 1947   Gross per 24 hour   Intake             2626 ml   Output  2175 ml   Net              451 ml      Stool Occurrence: 1    Physical Exam  General:  Alert, cooperative, no distress, sitting at edge of bed  head:  Normocephalic, without obvious abnormality, atraumatic  Eyes:  Conjunctivae/corneas clear.  Lungs:  Clear to auscultation bilaterally  Chest wall:  No tenderness or deformity.  Heart:    Regular rate and rhythm, S1, S2 normal, no murmur, click rub or gallop  MSK: decreased light sensation on LEs bilaterally; patchy, decreased strength bilaterally 1/5.   Left lower extremity great toe: Blackish discoloration consistent with dry gangrene on great toe and fourth toe.  No purulent fluid or signs of active cellulitis.  skin:   Skin color, texture, turgor normal.  No rashes or lesions  Neurologic:  CNII - XII intact.  Reflexes, power and sensory exam as above.    Lab Review  24-hour labs:    Results for orders placed or performed during the hospital encounter of 04/30/17 (from the past 24 hour(s))   CULTURE-URINE W/SENSITIVITY    Collection Time: 04/30/17 10:15 PM   Result Value Ref Range    Battery Name URINE CULTURE     Specimen Description URINE,INDWELLING CATH     Special Requests NONE     Culture NO GROWTH     Report Status FINAL  05/01/2017      CREATININE-URINE RANDOM    Collection Time: 04/30/17 10:15 PM   Result Value Ref Range    Creatinine, Random 104 MG/DL   SODIUM-URINE RANDOM    Collection Time: 04/30/17 10:15 PM   Result Value Ref Range    Sodium, Random 58 MMOL/L   PTT (APTT)    Collection Time: 04/30/17 11:33 PM   Result Value Ref Range    APTT 26.5 20.0 - 36.0 SEC   CULTURE-WOUND/TISSUE/FLUID(AEROBIC ONLY)W/SENSITIVITY Collection Time: 05/01/17  4:00 AM   Result Value Ref Range    Battery Name ROUTINE CULTURE     Specimen Description SWAB  LEFT  TOE  LE       Special Requests NONE     Direct Gram Stain       NO NEUTROPHILS SEEN  RARE  RBC'S  NO ORGANISMS SEEN      Culture      Report Status     GRAM STAIN    Collection Time: 05/01/17  4:00 AM   Result Value Ref Range    Battery Name GRAM STAIN      Specimen Description SWAB  LEFT  TOE  LE       Special Requests NONE     Gram Stain       NO NEUTROPHILS SEEN  RARE  RBC'S  NO ORGANISMS SEEN      Report Status FINAL  05/01/2017      CBC AND DIFF    Collection Time: 05/01/17  6:47 AM   Result Value Ref Range    White Blood Cells 8.7 4.5 - 11.0 K/UL    RBC 2.81 (L) 4.4 - 5.5 M/UL    Hemoglobin 9.1 (L) 13.5 - 16.5 GM/DL    Hematocrit 04.5 (L) 40 - 50 %    MCV 95.1 80 - 100 FL    MCH 32.3 26 - 34 PG    MCHC 33.9 32.0 - 36.0 G/DL    RDW 40.9 11 - 15 %    Platelet Count 221 150 - 400  K/UL    MPV 8.4 7 - 11 FL    Neutrophils 73 41 - 77 %    Lymphocytes 12 (L) 24 - 44 %    Monocytes 7 4 - 12 %    Eosinophils 7 (H) 0 - 5 %    Basophils 1 0 - 2 %    Absolute Neutrophil Count 6.30 1.8 - 7.0 K/UL    Absolute Lymph Count 1.00 1.0 - 4.8 K/UL    Absolute Monocyte Count 0.60 0 - 0.80 K/UL    Absolute Eosinophil Count 0.60 (H) 0 - 0.45 K/UL    Absolute Basophil Count 0.10 0 - 0.20 K/UL   BASIC METABOLIC PANEL    Collection Time: 05/01/17  6:47 AM   Result Value Ref Range    Sodium 135 (L) 137 - 147 MMOL/L    Potassium 4.2 3.5 - 5.1 MMOL/L    Chloride 103 98 - 110 MMOL/L    CO2 23 21 - 30 MMOL/L    Anion Gap 9 3 - 12    Glucose 134 (H) 70 - 100 MG/DL    Blood Urea Nitrogen 93 (H) 7 - 25 MG/DL    Creatinine 9.14 (H) 0.4 - 1.24 MG/DL    Calcium 8.1 (L) 8.5 - 10.6 MG/DL    eGFR Non African American 20 (L) >60 mL/min    eGFR African American 24 (L) >60 mL/min   PTT (APTT)    Collection Time: 05/01/17  6:47 AM   Result Value Ref Range    APTT 90.6 (H) 20.0 - 36.0 SEC   C REACTIVE PROTEIN (CRP) Collection Time: 05/01/17  6:47 AM   Result Value Ref Range    C-Reactive Protein 9.48 (H) <1.0 MG/DL   SED RATE    Collection Time: 05/01/17  6:47 AM   Result Value Ref Range    Sed Rate -ESR 30 (H) 0 - 20 MM/HR   PTT (APTT)    Collection Time: 05/01/17  1:37 PM   Result Value Ref Range    APTT 82.3 (H) 20.0 - 36.0 SEC       Point of Care Testing  (Last 24 hours)  Glucose: (!) 134 (05/01/17 7829)    Radiology and other Diagnostics Review:    Pertinent radiology reviewed.    Jorje Guild, MD   Team Pager [phone]

## 2017-05-02 NOTE — Progress Notes
General Progress Note    Name:  Justin Hunter   Today's Date:  05/02/2017  Admission Date: 04/30/2017  LOS: 2 days                     Assessment/Plan:    Active Problems:    Dyslipidemia    Hypertension    PVD (peripheral vascular disease) (HCC)    Coronary artery disease involving native coronary artery of native heart without angina pectoris    Necrosis of toe (HCC)    Acute kidney injury (HCC)    Paraplegia, incomplete (HCC)    Urinary retention    Neurogenic bowel    Justin Hunter is an 83 y.o. male with a past medical history of peripheral vascular disease status post multiple left lower extremity interventions, dry gangrene of the lower extremity left toe, spinal thoracic stroke who presents to the ED from his rehab facility after being noted to have progression of his left lower extremity toe dry gangrene with a worsening of the renal function.  ???  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-progressing  - No signs of cellulitis, purulence on exam  -Patient has an extensive history of peripheral vascular disease status post multiple interventions with the last one done on 9/17: Angiogram with TPI  -Patient had recent MRI done a few days ago at [facility] (requested to be uploaded to PACS)  -wbc 15.4 on admit --> 9.1  -Denies any fever/chills, patient is stable on admission  -ESR 30, CRP: 9.48  - BC NGTD, Wound gram stain negative, Wound cultures: NGTD, UC negative  - Patient not currently on any abx; has remained afebrile with WBC trending down  PLAN:  > Continue to monitor off abx therapy  > Patient was seen by vascular surgery in the ED: No acute surgical intervention was warranted--will need f/u on 9/28 to determine if any surgical plan   > Will get MRI with contrast of LLE foot to evaluate of osteomyelitis. Will hold off getting MRI today and look to get it with contrast in anticipation of patient's improving renal function  > Vascular surgery has seen the patient with no indications for interventions at this time. Patient will follow up with Dr. Yetta Hunter in 1 week   ???  AKI   - Creatinine on admission 3.46 with a BUN of 99  - BUN to creatinine ratio of 28.6; FeNa: 1.2%  - Cr 3.29 at the rehab facility prior to coming in. Baseline unknown but had Cr of 2.26 on 9/22 trending upwards from 1.82 on 9/18 as per outside records  - Patient denies any history of hematuria or other urinary symptoms  - Patient has a chronic Foley is in place; bedside bladder scan on admit with only 10ml urine  - Urinalysis was unremarkable for infection on admission; urine culture 9/27: negative  - Physical exam was positive for dry mucous membranes  - Might be due to poor volume status versus Foleys impairment versus other causes  - AKI improving s/p 3 L fluid since admission  PLAN:  > LR 1 L at 125 ml/hr  > Continue to hold on renal u/s as renal function is improving at this time  > Continue to hold PTA lisinopril, apixaban    Peripheral vascular disease status post multiple interventions  - Left lower extremity bypass graft thrombosis status post angiogram with TPA on 9/17  - LLE arterial bypass initially done 6-7 yrs ago. Found to have occlusion of the graft  of the graft s/p revision of the left lower extremity bypass graft with thrombectomy and popliteal artery endarterectomy on January 14, 2017.    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 Graft extending from LCFA to high calf with patent ant tibial artery. Short segment occluded graft in mid post tibial artery region  PLAN:  > Continue PTA aspirin, atorvastatin  > Hold PTA apixaban in light of impaired renal function  > Continue heparin gtt at this time in place of apixaban  > Vascular Surgery have seen the patient. No interventions indicated.  ??? 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 We talked about the likelihood of patient not recovering full function with residual deficits from his spinal stroke.  - PTOT following  - PRAFO boot ordered  - SCI nurse consulted  - Rehab medicine consulted  PLAN:   > Will need to assess rectal tone; consider voiding trial with removal of Foley cath on 9/28  > Bowel and bladder regimen per SCI nurse.   > Patient has limited information about the prognosis of his spinal stroke. He says that no one has talked to him about this and was distraught when told that he might not recover full function of his LE b/l. MRI back from OSH: Infarction vs TIA through the distal thoracic spine and conus medullaris.  > Rehab recs    ???  Hypertension  - PTA: Metoprolol 50 mg, lisinopril 10 mg  PLAN:  > PTA Metoprolol restarted today  ???  Hyperlipidemia:  > Continue PTA atorvastatin 40 mg  ???  ???  ???  Fluids: no additional IV fluids at this time.   PPX: heparin drip  Dispo: Admit to Med 1  Lytes: replace as needed  Code: [id number]    Discussed with Dr. Darleene Cleaver.     Justin Guild, MD  ________________________________________________________________________    Subjective  Justin Hunter is a 83 y.o. male.  Patient was without any acute discomfort today. He did not have any idea about the prognosis of his spinal stroke and was distraught when told he might not recover full function. Otherwise denied any fevers, chills, rigors, nausea, vomiting, sob.  Medications  Scheduled Meds:    aspirin tablet 325 mg 325 mg Oral QDAY   atorvastatin (LIPITOR) tablet 40 mg 40 mg Oral QDAY   bisacodyl (DULCOLAX) rectal suppository 10 mg 10 mg Rectal QDAY   heparin (porcine) BOLUS for continuous inf (bag) [phone] Units 20-40 Units/kg Intravenous As Prescribed   metoprolol XL (TOPROL XL) tablet 50 mg 50 mg Oral QDAY polyethylene glycol 3350 (MIRALAX) packet 17 g 1 packet Oral QDAY   senna/docusate (SENOKOT-S) tablet 2 tablet 2 tablet Oral BID   zinc sulfate capsule 220 mg 220 mg Oral QDAY   Continuous Infusions:  ??? heparin (porcine) 20,000 units/D5W 500 mL infusion (std conc)(premade) 1,311 Units/hr (05/02/17 0716)     PRN and Respiratory Meds:      Review of Systems:  A 14 point review of systems was negative except for: Neurological: positive for paresthesia and weakness    Objective:                          Vital  Signs: Last Filed                 Vital Signs: 24 Hour Range   BP: 149/71 (09/28 1120)  Temp: 37.1 ???C (98.7 ???F) (09/28 1120)  Pulse: 76 (09/28 1120)  Respirations: 18 PER MINUTE (09/28 1120)  SpO2: 99 % (09/28 1120)  O2 Delivery: None (Room Air) (09/28 1120) BP: (137-152)/(50-77)   Temp:  [36.4 ???C (97.6 ???F)-37.1 ???C (98.7 ???F)]   Pulse:  [76-95]   Respirations:  [16 PER MINUTE-18 PER MINUTE]   SpO2:  [94 %-99 %]   O2 Delivery: None (Room Air)     Vitals:    04/30/17 1350 04/30/17 2140   Weight: 88.5 kg (195 lb) 87.4 kg (192 lb 10.9 oz)       Intake/Output Summary:  (Last 24 hours)    Intake/Output Summary (Last 24 hours) at 05/02/17 1611  Last data filed at 05/02/17 3875   Gross per 24 hour   Intake              880 ml   Output             2350 ml   Net            -1470 ml      Stool Occurrence: 1    Physical Exam  General:  Alert, cooperative, no distress, lying in bed  Lungs:  Clear to auscultation bilaterally  Chest wall:  No tenderness or deformity.  Heart:    Regular rate and rhythm, S1, S2 normal, no murmur, click rub or gallop  MSK: decreased light sensation on LEs bilaterally; patchy, decreased strength bilaterally 1/5.   Left lower extremity great toe: Blackish discoloration consistent with dry gangrene on great toe and fourth toe.  No purulent fluid or signs of active cellulitis.  Neurologic:  CNII - XII intact.  Reflexes, power and sensory exam as above.    Lab Review  24-hour labs: Results for orders placed or performed during the hospital encounter of 04/30/17 (from the past 24 hour(s))   CBC AND DIFF    Collection Time: 05/02/17  5:51 AM   Result Value Ref Range    White Blood Cells 9.0 4.5 - 11.0 K/UL    RBC 2.90 (L) 4.4 - 5.5 M/UL    Hemoglobin 9.5 (L) 13.5 - 16.5 GM/DL    Hematocrit 64.3 (L) 40 - 50 %    MCV 93.1 80 - 100 FL    MCH 32.7 26 - 34 PG    MCHC 35.1 32.0 - 36.0 G/DL    RDW 32.9 11 - 15 %    Platelet Count 260 150 - 400 K/UL    MPV 8.5 7 - 11 FL    Neutrophils 66 41 - 77 %    Lymphocytes 17 (L) 24 - 44 %    Monocytes 8 4 - 12 %    Eosinophils 8 (H) 0 - 5 %    Basophils 1 0 - 2 %    Absolute Neutrophil Count 6.00 1.8 - 7.0 K/UL    Absolute Lymph Count 1.50 1.0 - 4.8 K/UL    Absolute Monocyte Count 0.70 0 - 0.80 K/UL    Absolute Eosinophil Count 0.70 (H) 0 - 0.45 K/UL    Absolute Basophil Count 0.10 0 - 0.20 K/UL   BASIC METABOLIC PANEL    Collection Time: 05/02/17  5:51 AM   Result Value Ref Range    Sodium 135 (L) 137 -  147 MMOL/L    Potassium 4.5 3.5 - 5.1 MMOL/L    Chloride 103 98 - 110 MMOL/L    CO2 24 21 - 30 MMOL/L    Anion Gap 8 3 - 12    Glucose 144 (H) 70 - 100 MG/DL    Blood Urea Nitrogen 80 (H) 7 - 25 MG/DL    Creatinine 6.04 (H) 0.4 - 1.24 MG/DL    Calcium 8.1 (L) 8.5 - 10.6 MG/DL    eGFR Non African American 22 (L) >60 mL/min    eGFR African American 27 (L) >60 mL/min   PTT (APTT)    Collection Time: 05/02/17  5:51 AM   Result Value Ref Range    APTT 83.2 (H) 20.0 - 36.0 SEC       Point of Care Testing  (Last 24 hours)  Glucose: (!) 144 (05/02/17 0551)    Radiology and other Diagnostics Review:    Pertinent radiology reviewed.    Mirra Basilio A. Ramaya Guile, MBBS   Team Pager [phone]

## 2017-05-02 NOTE — Progress Notes
CLINICAL NUTRITION                                                        Clinical Nutrition Assessment Summary     NAME:Justin Hunter             MRN: [id number]             DOB:Oct 31, 1932          AGE: 83 y.o.  ADMISSION DATE: 04/30/2017             DAYS ADMITTED: LOS: 2 days    Nutrition Assessment of Patient:  BMI Categories Adult: Over Weight: 25-29.9 (BMI 29.3)  Malnutrition Assessment: Does not meet criteria  Current Oral Intake: Marginally Adequate  Estimated Calorie Needs: [phone] (30-35kcal/kg desired bw 73.6kg)  Estimated Protein Needs: 110-147 (1.5-2g/kg desired body wt of 73.6kg)  Oral Diet Order: Regular    Comments:  Justin Hunter is an 83 y.o. male with a past medical history of peripheral vascular disease status post multiple left lower extremity interventions, dry gangrene of the lower extremity left toe, spinal thoracic stroke who presents to the ED from his rehab facility after being noted to have progression of his left lower extremity toe dry gangrene with a worsening of the renal function. Pt states he eats 2-3 meals/day at home and does not snack very much. He has some trouble recalling what he normally eats at home but says he likes eggs, toast, and cereal. He is eating less since admit because he does not like the food here. He is very uninterested in making dietary changes, or trying any ONS. Dietetic Intern persuaded him to at least try a chocolate pudding. Emphasized the importance of protein in wound healing and gave pt some ideas on how to add more protein into his diet. Pt is somewhat averse to nutrition services but daughter and spouse encourage his participation. Pt and spouse are unsure of any weight loss but do not think he has had any. Pt reports a usual body weight of approximately 195#. Current body weight 192#, 9/26, 24# weight loss in past 4 months, 216# on 5/26. Patient suffers from multiple pressure ulcers, most of which are stage 2. For this reason, nutrition risk is identified. +BM 9/26. Pitting edema 3+ RLE; LLE.     Recommendation:  Recommend continued regular diet despire renal issues to promote intake. Recommend boost pudding QID                                Intervention / Plan:  Educated on importance of protein and how to increase protein  Monitor wt and pressure ulcers  RD to order boost pudding QID  RD to order zinc, noted renal multivitamin contraindicated d/t niacin allergy    Nutrition Diagnosis:  Increased nutrient needs, specify: (kcal protein)  Etiology: increased demands for wound healing  Signs & Symptoms: multiple advanced stage pressure ulcers    Goals:  Prevent further skin breakdown  Time Frame: Throughout Stay    Rise Patience, Dietetic Intern  *[phone]

## 2017-05-02 NOTE — Progress Notes
PHYSICAL THERAPY  PROGRESS NOTE       MOBILITY:  Mobility  Progressive Mobility Level: Sit on edge of bed  Level of Assistance: Assist X2  Time Tolerated: 11-30 minutes  Activity Limited By: Fatigue;Weakness    SUBJECTIVE:  Subjective  Significant hospital events: Justin Hunter is a 83 y.o. male with a history of PVD. He underwent a LLE bypass at [facility]SH 9/17. Procedure was complicated by need for thrombolysis post-op. 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 He has been at rehab approx 2 days. He was sent to [facility] ED due to rising creatinine. He has had discoloration of his toes on his left foot for some time and vascular surgery was asked to evaluate.  Mental / Cognitive Status: Alert;Cooperative;Follows Commands  Persons Present: Daughter;Spouse;Nursing Staff;Provider;Family  Pain: Patient has no complaint of pain  Comments: Patient requesting to practice slide board transfer this date. Therapist explained that we would evaluate appropriateness of slide board transfer at this timel    BED MOBILITY/TRANSFERS:  Bed Mobility/Transfers  Bed Mobility: Rolling: Moderate Assist;Verbal Cues;Bed Flat;Use of Rail;Assist with B LE  Bed Mobility: Sit to Supine: Maximum Assist;Verbal Cues;Bed Flat;Use of Rail;Safety Considerations;Assist with B LE;Assist with Trunk    BALANCE:  Balance  Sitting Balance: Static Sitting Balance;2 UE Support;Standby Assist;Minimal Assist;Dynamic Sitting Balance;Moderate Assist  Comments: Practiced scooting along edge of bed. Patient requires moderate assist to left and maximum assist (balance/anterior stability) to right. Explained progression from scooting to practicing slide board transfer. Patient able to demonstrate understanding of transfer mechanics.    ASSESSMENT/PROGRESS:  Assessment/Progress  Impaired Mobility Due To: Decreased Strength;Post Surgical Changes;Impaired Balance  Assessment/Progress: Should Improve w/ Continued PT AM-PAC 6 Clicks Basic Mobility Inpatient  Turning from your back to your side while in a flat bed without using bed rails: A lot  Moving from lying on your back to sitting on the side of a flatbed without using bedrails : A Lot  Moving to and from a bed to a chair (including a wheelchair): Total  Standing up from a chair using your arms (e.g. wheelchair, or bedside chair): Total  To walk in hospital room: Total  Climbing 3-5 steps with a railing: Total  Raw Score: 8  Standardized (T-scale) Score: 22.61  Basic Mobility CMS 0-100%: 85.35  CMS G Code Modifier for Basic Mobility: CM    GOALS:  Goals  Goal Formulation: With Patient/Family  Time For Goal Achievement: 7 days  Pt Will Go Supine To/From Sit: w/ Minimal Assist  Pt Will Transfer Bed/Chair: w/ Moderate Assist  Pt Will Transfer Sit to Stand: w/ Maximum Assist  Pt Will Achieve Sitting Balance: Standby Assist  Patientt Will Sit Edge Of Bed: 11-15 Minutes, w/ Stand By Assist    PLAN:  Plan   Treatment Interventions: Mobility Training;Strengthening;Balance Activities  Plan Frequency: 5-7 Days per Week  PT Plan for Next Visit: *progress pt assist with rolling and bed mobility *sitting balance *LE strengthening *trial slide board once sitting balance improves     RECOMMENDATIONS:  PT Discharge Recommendations  PT Discharge Recommendations: Inpatient Setting;Recommend Physical Medicine and Rehabilitation Consult to address most appropriate level of rehabilitation placement  Equipment Recommendations: Too early to be determined    Therapist: Lauretta Chester, PTA  Date: 05/02/2017

## 2017-05-02 NOTE — Progress Notes
OCCUPATIONAL THERAPY  ASSESSMENT NOTE    Patient Name: Justin Hunter                   Room/Bed: UJ8119/14  Admitting Diagnosis:   Past Medical History:   Diagnosis Date   ??? DVT (deep venous thrombosis) (HCC) 12/28/2014   ??? Dyslipidemia 12/28/2014   ??? History of CEA (carotid endarterectomy) 12/28/2014   ??? PVD (peripheral vascular disease) (HCC) 12/28/2014       Mobility  Progressive Mobility Level: Active transfer to chair  Level of Assistance: Assist X2  Assistive Device: Lift - total body    Subjective  Patient Stated Goals: pt and family asking appropriate questions and able to verbalize understanding  Precautions: Standard;Falls  Pain / Complaints: Patient agrees to participate in therapy  Pain Location:  (generalized)    Objective  Psychosocial Status: Willing and Cooperative to Participate  Persons Present: Spouse;Daughter    Home Living  Type of Home: Mobile Home (6 steps into home)  Home Layout: Able to Live on Main Level w/Bedrm/Bathrm Acupuncturist / Tub: Psychologist, counselling    Prior Function  Level Of Independence: Independent with ADLs and functional transfers  Other Function Comments: pt was at [facility] prior to admit and wanting to get back there    ADL's  Where Assessed: Chair  Eating Assist: Stand By Assist  Grooming Assist: Stand By Assist  LE Dressing Assist: Total Assist  Toileting Assist: Total Assist  Functional Transfer Assist: Total Assist (has been using the hoyer lift to a chair)  Comment: pt up in chair with family in the room. pts wife assisting to order pts breakfast.  pt with good hand to mouth to perform grooming/feeding with set up.  pt wanting to wait to get breakfast first before brushing teeth. pt able to demo ability to lean in chair and almost reach his backside.  Educated with progression of LE adls in bed to allow rolling to pull up his pants. pt currently with a catheter in place.  educated with need to continue to push UE strengthening to help with transfers; pt and family verbalized understanding    Activity Tolerance  Endurance: 3/5 Tolerates 25-30 Minutes Exercise w/Multiple Rests    Cognition  Overall Cognitive Status: WFL to Adequately Complete Self Care Tasks Safely (pt with slight delay with answers and looking to his family to answer questions )    UE AROM  Overall BUE AROM WNL: Yes  Grasp: Bilateral Grasp Functional for Activity     EXERCISE: OT provided pt with green theraband today (may need red) and a rubberband hand gripper with 2 bands on 09/28    UE Strength / Tone  Overall Strength / Tone: WFL Able to Perform ADL Tasks (pt needing increased UE strength to help with transfers)    Education  Persons Educated: Patient/Family  Barriers To Learning: None Noted  Teaching Methods: Verbal Instruction;Demonstration  Patient Response: Verbalized and Demo Understanding  Topics: Role of OT, Goals for Therapy;DME for Home Discharge;UE Exercises;Energy Conservation;Home safety;ADL Compensatory Techniques;Adaptive Devices for ADLs  Home Exercise Program: Theraband therex  Goal Formulation: With Patient/Family    Assessment  Assessment: Decreased ADL Status;Decreased UE Strength;Decreased Endurance;Decreased Self-Care Trans;Decreased High-Level ADLs  Prognosis: Good;w/Cont OT s/p Acute Discharge  Goal Formulation: Pt/family    AM-PAC 6 Clicks Daily Activity Inpatient  Putting on and taking off regular lower body clothes?: Total  Bathing (Including washing, rinsing, drying): A Lot  Toileting, which includes using toilet,  bedpan, or urinal: Total  Putting on and taking off regular upper body clothing: A Little  Taking care of personal grooming such as brushing teeth: A Little  Eating meals?: A Little  Daily Activity Raw Score: 13  Standardized (t-scale) score: 32.03  CMS 0-100% Score: 63.03  CMS G Code Modifier: CL     G-Codes: Self-care  [id number] Current Status:  60-79% Impairment  [id number] Goal Status: 40-59% Impairment Based on above evaluation and clinical judgment.    Plan   Progress: Progressing Toward Goals  OT Frequency: 5x/week  OT Plan for Next Visit: work on functional transfers (?sliding board); work on [employer] and sitting balance/leaning for toileting needs. progress to commode when appropriate; w/c level in bathroom for grooming; work on LE dressing in bed for rolling to pull up pants; continue to push UE strength and endurance activities.      ADL Goals  Patient Will Perform Grooming: at [facility] of Bed;w/ Stand By Assist  Patient Will Perform UE Dressing: At [facility] of Bed;In Chair;w/ Stand By Assist  Patient Will Perform LE Dressing: w/ Moderate Assist;At Edge of Bed;Supine  Patient Will Perform Toileting: w/ Bedside Commode;w/ Moderate Assist    Functional Transfer Goals  Pt Will Perform All Functional Transfers: Moderate Assist, w/ Assistive Devices, w/ Good Judgment/Safety    Arm Goals  Pt  Will Complete Theraband Exer: B UE, 1 Set, 10 Reps, 15 Reps, Level 3 Theraband    OT Discharge Recommendations  OT Discharge Recommendations: Inpatient Setting (anticipate back to [facility])  Equipment Recommendations:  (continue to assess)  Recommend ongoing assistance for: In and out of house, Transfers, Bed mobility, Stairs, Meal preparation, Safety concerns     Therapist: Simon Rhein, OTR/L [id number]  Date: 05/02/2017

## 2017-05-02 NOTE — Progress Notes
He is without new complaints overnight.  I spoke with [name] after seeing him in the emergency room yesterday.  Please see my addendum to the original consult for details.  Would continue protective measures on both feet prevent further injury.  He may follow-up with [name] as previously scheduled next week.  Would be happy to reevaluate him if other vascular concerns arise during his admission.    Gladstone Lighter, MD

## 2017-05-02 NOTE — Patient Education
Education    Bless Belshe accepted counseling and was engaged.  he verbalized understanding.    The following medications were discussed:  Heparin, Metoprolol, Senna, Miralax, zinc      Where indicated, the patient was provided with additional medication and/or disease-state information.  All patient questions were answered and patient acknowledged understanding of the medications, side effects and other pertinent medication information.    Follow up should occur daily.    Continue to address: certain medications    Jen Mow

## 2017-05-02 NOTE — Patient Education
Medication Education    [name] accepted counseling and was receptive.  he verbalized understanding.    The following medications were discussed:    Senna  Heparin  Pressure Injury Prevention  Fall Risk Bundle    Where indicated, the patient was provided with additional medication and/or disease-state information.  All patient questions were answered and patient acknowledged understanding of the medications, side effects and other pertinent medication information.    Follow up should occur daily.    Continue to address: indications    Justin Hunter

## 2017-05-03 LAB — BASIC METABOLIC PANEL: Lab: 136 MMOL/L — ABNORMAL LOW (ref 137–147)

## 2017-05-03 LAB — PTT (APTT): Lab: 79 s — ABNORMAL HIGH (ref 20.0–36.0)

## 2017-05-03 LAB — CBC AND DIFF: Lab: 8.6 K/UL — ABNORMAL LOW (ref >60)

## 2017-05-03 MED ORDER — SERTRALINE 50 MG PO TAB
50 mg | Freq: Every day | ORAL | 0 refills | Status: DC
Start: 2017-05-03 — End: 2017-05-05
  Administered 2017-05-04 – 2017-05-05 (×2): 50 mg via ORAL

## 2017-05-03 MED ORDER — LACTATED RINGERS IV SOLP
1000 mL | Freq: Once | INTRAVENOUS | 0 refills | Status: CP
Start: 2017-05-03 — End: ?
  Administered 2017-05-04: 09:00:00 1000 mL via INTRAVENOUS

## 2017-05-03 MED ORDER — BISACODYL 10 MG RE SUPP
10 mg | Freq: Every day | RECTAL | 0 refills | Status: DC | PRN
Start: 2017-05-03 — End: 2017-05-05

## 2017-05-03 MED ORDER — LACTATED RINGERS IV SOLP
2000 mL | Freq: Once | INTRAVENOUS | 0 refills | Status: CP
Start: 2017-05-03 — End: ?
  Administered 2017-05-03: 18:00:00 2000 mL via INTRAVENOUS

## 2017-05-03 MED ORDER — DOCUSATE SODIUM 100 MG PO CAP
200 mg | Freq: Every day | ORAL | 0 refills | Status: DC
Start: 2017-05-03 — End: 2017-05-05
  Administered 2017-05-03 – 2017-05-05 (×3): 200 mg via ORAL

## 2017-05-03 MED ORDER — SENNOSIDES-DOCUSATE SODIUM 8.6-50 MG PO TAB
2 | Freq: Every evening | ORAL | 0 refills | Status: DC
Start: 2017-05-03 — End: 2017-05-05

## 2017-05-03 MED ORDER — LACTATED RINGERS IV SOLP
2000 mL | Freq: Once | INTRAVENOUS | 0 refills | Status: DC
Start: 2017-05-03 — End: 2017-05-03

## 2017-05-03 NOTE — Progress Notes
General Progress Note    Name:  Justin Hunter   Today's Date:  05/03/2017  Admission Date: 04/30/2017  LOS: 3 days                     Assessment/Plan:    Active Problems:    Dyslipidemia    Hypertension    PVD (peripheral vascular disease) (HCC)    Coronary artery disease involving native coronary artery of native heart without angina pectoris    Necrosis of toe (HCC)    Acute kidney injury (HCC)    Paraplegia, incomplete (HCC)    Urinary retention    Neurogenic bowel    Justin Hunter is an 83 y.o. male with a past medical history of peripheral vascular disease status post multiple left lower extremity interventions, dry gangrene of the lower extremity left toe, spinal thoracic stroke who presents to the ED from his rehab facility after being noted to have progression of his left lower extremity toe dry gangrene with a worsening of the renal function.  ???  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-progressing  - No signs of cellulitis, purulence on exam  -Patient has an extensive history of peripheral vascular disease status post multiple interventions with the last one done on 9/17: Angiogram with TPI  -wbc 15.4 on admit --> 8.6  -Denies any fever/chills, patient is stable on admission  -ESR 30, CRP: 9.48  - BC NGTD, Wound gram stain negative, Wound cultures: NGTD, UC negative  - Patient not currently on any abx; has remained afebrile with WBC trending down  PLAN:  > Continue to monitor off abx therapy  > Patient was seen by vascular surgery in the ED: No acute surgical intervention was warranted- will follow up with [name] post discharge for further plan  > Will get MRI likely tomorrow with contrast of LLE foot to evaluate of osteomyelitis pending improvement in renal function   > Vascular surgery has seen the patient with no indications for interventions at this time. Patient will follow up with [name] in 1 week   ???    AKI   - Creatinine on admission 3.46 with a BUN of 99 - BUN to creatinine ratio of 28.6; FeNa: 1.2%  - Cr 3.29 at the rehab facility prior to coming in. Baseline unknown but had Cr of 2.26 on 9/22 trending upwards from 1.82 on 9/18 as per outside records  - Patient denies any history of hematuria or other urinary symptoms  - Patient has a chronic Foley is in place; bedside bladder scan on admit with only 10ml urine  - Urinalysis was unremarkable for infection on admission; urine culture 9/27: negative  - Physical exam was positive for dry mucous membranes  - Might be due to poor volume status versus Foleys impairment versus other causes  - AKI improving s/p 4 L fluid since admission   - Cr down to 2.24 9/29   PLAN:  > Additional LR 2 L today   > Continue to hold on renal u/s as renal function is improving at this time  > Continue to hold PTA lisinopril, apixaban    Peripheral vascular disease status post multiple interventions  - Left lower extremity bypass graft thrombosis status post angiogram with TPA on 9/17  - LLE arterial bypass initially done 6-7 yrs ago. Found to have occlusion of the graft of the graft s/p revision of the left lower extremity bypass graft with thrombectomy and popliteal artery endarterectomy on January 14, 2017.    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 Graft extending from LCFA to high calf with patent ant tibial artery. Short segment occluded graft in mid post tibial artery region  PLAN:  > Continue PTA aspirin, atorvastatin  > Hold PTA apixaban in light of impaired renal function  > Continue heparin gtt at this time in place of apixaban  > Vascular Surgery have seen the patient. No interventions indicated.  ???  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 We talked about the likelihood of patient not recovering full function with residual deficits from his spinal stroke.  - PTOT following  - PRAFO boot ordered  - SCI nurse consulted  - Rehab medicine following  - Patient continues to have fecal incontinence 2/2 neurogenic bowel  PLAN:   > Consider voiding trial with removal of Foley cath on 9/29  > Bowel and bladder regimen per SCI nurse.   > Zoloft 50 mg started 9/29     ???  Hypertension  - PTA: Metoprolol 50 mg, lisinopril 10 mg  - Patient's SBP ranging up to 150s 9/29  PLAN:  > PTA Metoprolol 50 mg  > Will continue to hold PTA lisinopril due to AKI  > IF SBP consistently in 160s, consider starting on amlodipine for better control   ???  Hyperlipidemia:  > Continue PTA atorvastatin 40 mg  ???  ???  ???  Fluids: no additional IV fluids at this time.   PPX: heparin drip  Dispo: Admit to Med 1  Lytes: replace as needed  Code: [id number]    Discussed with Dr. Darleene Cleaver.     Jorje Guild, MD  ________________________________________________________________________    Subjective  Justin Hunter is a 83 y.o. male.  Patient seen this morning, lying in his bed. He appeared comfortable without any acute discomfort. Patient denies any overnight complaints including fevers, N/V, SOB, CP, cough.    Medications  Scheduled Meds:    aspirin tablet 325 mg 325 mg Oral QDAY   atorvastatin (LIPITOR) tablet 40 mg 40 mg Oral QDAY   docusate (COLACE) capsule 200 mg 200 mg Oral QDAY   heparin (porcine) BOLUS for continuous inf (bag) (623) [phone] Units 20-40 Units/kg Intravenous As Prescribed   metoprolol XL (TOPROL XL) tablet 50 mg 50 mg Oral QDAY   polyethylene glycol 3350 (MIRALAX) packet 17 g 1 packet Oral QDAY   senna/docusate (SENOKOT-S) tablet 2 tablet 2 tablet Oral QHS   sertraline (ZOLOFT) tablet 50 mg 50 mg Oral QDAY   zinc sulfate capsule 220 mg 220 mg Oral QDAY   Continuous Infusions: ??? heparin (porcine) 20,000 units/D5W 500 mL infusion (std conc)(premade) 1,311 Units/hr (05/03/17 0734)     PRN and Respiratory Meds:      Review of Systems:  A 14 point review of systems was negative except for: Neurological: positive for paresthesia and weakness    Objective:                          Vital Signs: Last Filed                 Vital Signs: 24 Hour Range   BP: 137/54 (09/29 1120)  Temp:  36.7 ???C (98 ???F) (09/29 1120)  Pulse: 70 (09/29 1120)  Respirations: 18 PER MINUTE (09/29 1120)  SpO2: 97 % (09/29 1120)  O2 Delivery: None (Room Air) (09/29 1120) BP: (137-159)/(53-65)   Temp:  [36.4 ???C (97.6 ???F)-37.1 ???C (98.7 ???F)]   Pulse:  [69-72]   Respirations:  [16 PER MINUTE-20 PER MINUTE]   SpO2:  [94 %-100 %]   O2 Delivery: None (Room Air)     Vitals:    04/30/17 1350 04/30/17 2140   Weight: 88.5 kg (195 lb) 87.4 kg (192 lb 10.9 oz)       Intake/Output Summary:  (Last 24 hours)    Intake/Output Summary (Last 24 hours) at 05/03/17 1356  Last data filed at 05/03/17 1120   Gross per 24 hour   Intake             3000 ml   Output             2650 ml   Net              350 ml      Stool Occurrence: 0    Physical Exam  General:  Alert, cooperative, no distress, lying in bed, no JVD at 90 degrees  Lungs:  Clear to auscultation bilaterally  Chest wall:  No tenderness or deformity.  Heart:    Regular rate and rhythm, S1, S2 normal, no murmur, click rub or gallop  MSK: decreased light sensation on LEs bilaterally; patchy, decreased strength bilaterally 1/5.   Left lower extremity great toe: Patient's L foot bandaged and packed    Lab Review  24-hour labs:    Results for orders placed or performed during the hospital encounter of 04/30/17 (from the past 24 hour(s))   CBC AND DIFF    Collection Time: 05/03/17  6:00 AM   Result Value Ref Range    White Blood Cells 8.6 4.5 - 11.0 K/UL    RBC 2.97 (L) 4.4 - 5.5 M/UL    Hemoglobin 9.6 (L) 13.5 - 16.5 GM/DL    Hematocrit 16.1 (L) 40 - 50 %    MCV 94.8 80 - 100 FL MCH 32.2 26 - 34 PG    MCHC 33.9 32.0 - 36.0 G/DL    RDW 09.6 11 - 15 %    Platelet Count 251 150 - 400 K/UL    MPV 8.6 7 - 11 FL    Neutrophils 70 41 - 77 %    Lymphocytes 16 (L) 24 - 44 %    Monocytes 6 4 - 12 %    Eosinophils 7 (H) 0 - 5 %    Basophils 1 0 - 2 %    Absolute Neutrophil Count 6.00 1.8 - 7.0 K/UL    Absolute Lymph Count 1.30 1.0 - 4.8 K/UL    Absolute Monocyte Count 0.50 0 - 0.80 K/UL    Absolute Eosinophil Count 0.60 (H) 0 - 0.45 K/UL    Absolute Basophil Count 0.10 0 - 0.20 K/UL   BASIC METABOLIC PANEL    Collection Time: 05/03/17  6:00 AM   Result Value Ref Range    Sodium 136 (L) 137 - 147 MMOL/L    Potassium 4.2 3.5 - 5.1 MMOL/L    Chloride 106 98 - 110 MMOL/L    CO2 23 21 - 30 MMOL/L    Anion Gap 7 3 - 12    Glucose 142 (H) 70 - 100 MG/DL    Blood Urea Nitrogen 58 (  H) 7 - 25 MG/DL    Creatinine 0.98 (H) 0.4 - 1.24 MG/DL    Calcium 8.0 (L) 8.5 - 10.6 MG/DL    eGFR Non African American 28 (L) >60 mL/min    eGFR African American 34 (L) >60 mL/min   PTT (APTT)    Collection Time: 05/03/17  6:00 AM   Result Value Ref Range    APTT 79.5 (H) 20.0 - 36.0 SEC       Point of Care Testing  (Last 24 hours)  Glucose: (!) 142 (05/03/17 0600)    Radiology and other Diagnostics Review:    Pertinent radiology reviewed.    Mirka Barbone A. Heron Pitcock, MBBS   Team Pager [phone]

## 2017-05-03 NOTE — Patient Education
Medication Education    Justin Hunter accepted counseling and was interactive.  he verbalized understanding.    The following medications were discussed:  Aspirin  Lipitor  Colace  Heparin  Metoprolol  Miralax  Zinc    Where indicated, the patient was provided with additional medication and/or disease-state information.  All patient questions were answered and patient acknowledged understanding of the medications, side effects and other pertinent medication information.    Follow up should occur as needed.    Continue to address: indications    Regenia Skeeter, RN

## 2017-05-04 ENCOUNTER — Encounter: Admit: 2017-05-04 | Discharge: 2017-05-04 | Payer: MEDICARE

## 2017-05-04 DIAGNOSIS — I7389 Other specified peripheral vascular diseases: ICD-10-CM

## 2017-05-04 LAB — BASIC METABOLIC PANEL
Lab: 136 MMOL/L — ABNORMAL LOW (ref >60)
Lab: 1.9 mg/dL — ABNORMAL HIGH (ref 0.4–1.24)
Lab: 136 MMOL/L — ABNORMAL LOW (ref 137–147)
Lab: 161 mg/dL — ABNORMAL HIGH (ref 70–100)
Lab: 24 MMOL/L (ref 21–30)
Lab: 34 mL/min — ABNORMAL LOW (ref >60)
Lab: 38 mg/dL — ABNORMAL HIGH (ref 7–25)
Lab: 4.6 MMOL/L — ABNORMAL LOW (ref 3.5–5.1)
Lab: 41 mL/min — ABNORMAL LOW (ref >60)
Lab: 8 g/dL (ref 3–12)
Lab: 8 mg/dL — ABNORMAL LOW (ref 8.5–10.6)

## 2017-05-04 LAB — CBC AND DIFF: Lab: 9.4 K/UL — ABNORMAL HIGH (ref >60)

## 2017-05-04 LAB — BNP (B-TYPE NATRIURETIC PEPTI): Lab: 391 pg/mL — ABNORMAL HIGH (ref 0–100)

## 2017-05-04 LAB — PTT (APTT): Lab: 85 s — ABNORMAL HIGH (ref 20.0–36.0)

## 2017-05-04 MED ORDER — AMLODIPINE 10 MG PO TAB
10 mg | Freq: Every day | ORAL | 0 refills | Status: DC
Start: 2017-05-04 — End: 2017-05-05
  Administered 2017-05-04 – 2017-05-05 (×2): 10 mg via ORAL

## 2017-05-04 MED ORDER — LACTATED RINGERS IV SOLP
1000 mL | INTRAVENOUS | 0 refills | Status: CP
Start: 2017-05-04 — End: ?
  Administered 2017-05-04: 17:00:00 1000 mL via INTRAVENOUS

## 2017-05-04 NOTE — Progress Notes
Justin Hunter, [name], MBBS notified of BP 168/62. RN to give AM medications and will recheck BP. No new orders at this time.

## 2017-05-04 NOTE — Progress Notes
1722 M1 paged regarding patient's BP 176/83.    1728 Per Sami, [place], MBBS, recheck BP in 30 minutes. Page back if SBP >160.     1802 M1 paged recheck BP is 163/58. No new orders at this time. RN will continue to monitor.

## 2017-05-04 NOTE — Case Management (ED)
SOCIAL WORK NOTE      Name:  Justin Hunter #:  [id number]     PLAN: Pt will discharge to [facility] pending medical stability.     INTERVENTION: Thayer Ohm from [facility] paged, requesting updated clinicals. SW faxed updates as requested.     Geralynn Rile   Social Work   *[phone]

## 2017-05-04 NOTE — Patient Education
Medication Education    Justin Hunter accepted counseling and was interactive. he verbalized understanding.    The following medications were discussed:  Norvasc  Aspirin  Lipitor  Colace  Heparin  Metoprolol  Miralax  Zoloft  Zinc    Where indicated, the patient was provided with additional medication and/or disease-state information. All patient questions were answered and patient acknowledged understanding of the medications, side effects and other pertinent medication information.    Follow up should occur as needed.    Continue to address: indications    Regenia Skeeter, RN

## 2017-05-04 NOTE — Progress Notes
Patient was bladder scanned at 0241 and 547 mL of urine was found to be present. Patient tried voiding on his own but was unable to. Straight catheterization performed; 800 mL of urine out. Post cath residual was 15 mL. Patient educated on significance of these intermittent catheterizations and importance of emptying his bladder. Will continue to monitor closely.

## 2017-05-04 NOTE — Progress Notes
Sami, [place], MBBS notified of BP 173/61. Orders placed. RN will continue to monitor.

## 2017-05-04 NOTE — Case Management (ED)
Case Management Progress Note    NAME:Justin Hunter                          MRN: [id number]              DOB:[birth year]          AGE: 83 y.o.  ADMISSION DATE: 04/30/2017             DAYS ADMITTED: LOS: 4 days      Todays Date: 05/04/2017    Plan: Pt to return to [facility] when medically stable.     Interventions  ? Support      ? Info or Referral      ? Discharge Planning   Discharge Planning: Inpatient Rehabilitation      SW spoke to team and pt is not stable for discharge today.    Weekend report updated and primary SW to follow up tomorrow for discharge planning.     ? Medication Needs      ? Financial      ? Legal      ? Other        Disposition  ? Expected Discharge Date    Expected Discharge Date: 05/04/17  ? Transportation   Does the patient need discharge transport arranged?: Yes  Transportation Name, Phone and Availability #1: Facility transport   ? Next Level of Care (Acute Psych discharges only)      ? Discharge Disposition    Durable Medical Equipment     No service has been selected for the patient.      [place] Destination - Selection Complete     Service Request Status Selected Specialties Address Phone Number Fax Number    [facility] Selected Inpatient Rehabilitation Facility [address] [place], [place] [address] [phone]      [facility]     No service has been selected for the patient.      [facility]     No service has been selected for the patient.          Wilfrid Lund, LMSW  Social Work Case Interior and spatial designer [phone]

## 2017-05-05 ENCOUNTER — Inpatient Hospital Stay: Admit: 2017-05-05 | Discharge: 2017-05-05 | Payer: MEDICARE

## 2017-05-05 ENCOUNTER — Emergency Department: Admit: 2017-04-30 | Discharge: 2017-04-30 | Payer: MEDICARE

## 2017-05-05 ENCOUNTER — Inpatient Hospital Stay: Admit: 2017-05-04 | Discharge: 2017-05-04 | Payer: MEDICARE

## 2017-05-05 DIAGNOSIS — I771 Stricture of artery: ICD-10-CM

## 2017-05-05 DIAGNOSIS — E785 Hyperlipidemia, unspecified: ICD-10-CM

## 2017-05-05 DIAGNOSIS — T82868A Thrombosis of vascular prosthetic devices, implants and grafts, initial encounter: ICD-10-CM

## 2017-05-05 DIAGNOSIS — E1165 Type 2 diabetes mellitus with hyperglycemia: ICD-10-CM

## 2017-05-05 DIAGNOSIS — Z86718 Personal history of other venous thrombosis and embolism: ICD-10-CM

## 2017-05-05 DIAGNOSIS — Z955 Presence of coronary angioplasty implant and graft: ICD-10-CM

## 2017-05-05 DIAGNOSIS — D72829 Elevated white blood cell count, unspecified: ICD-10-CM

## 2017-05-05 DIAGNOSIS — E1152 Type 2 diabetes mellitus with diabetic peripheral angiopathy with gangrene: Principal | ICD-10-CM

## 2017-05-05 DIAGNOSIS — G8222 Paraplegia, incomplete: ICD-10-CM

## 2017-05-05 DIAGNOSIS — E872 Acidosis: ICD-10-CM

## 2017-05-05 DIAGNOSIS — Z7901 Long term (current) use of anticoagulants: ICD-10-CM

## 2017-05-05 DIAGNOSIS — D649 Anemia, unspecified: ICD-10-CM

## 2017-05-05 DIAGNOSIS — Z7982 Long term (current) use of aspirin: ICD-10-CM

## 2017-05-05 DIAGNOSIS — I2510 Atherosclerotic heart disease of native coronary artery without angina pectoris: ICD-10-CM

## 2017-05-05 DIAGNOSIS — R339 Retention of urine, unspecified: ICD-10-CM

## 2017-05-05 DIAGNOSIS — I129 Hypertensive chronic kidney disease with stage 1 through stage 4 chronic kidney disease, or unspecified chronic kidney disease: ICD-10-CM

## 2017-05-05 DIAGNOSIS — N179 Acute kidney failure, unspecified: ICD-10-CM

## 2017-05-05 DIAGNOSIS — Z66 Do not resuscitate: ICD-10-CM

## 2017-05-05 DIAGNOSIS — K592 Neurogenic bowel, not elsewhere classified: ICD-10-CM

## 2017-05-05 DIAGNOSIS — N189 Chronic kidney disease, unspecified: ICD-10-CM

## 2017-05-05 DIAGNOSIS — N319 Neuromuscular dysfunction of bladder, unspecified: ICD-10-CM

## 2017-05-05 DIAGNOSIS — E1122 Type 2 diabetes mellitus with diabetic chronic kidney disease: ICD-10-CM

## 2017-05-05 DIAGNOSIS — I7389 Other specified peripheral vascular diseases: ICD-10-CM

## 2017-05-05 LAB — CBC AND DIFF: Lab: 11 K/UL — ABNORMAL HIGH (ref 4.5–11.0)

## 2017-05-05 LAB — PTT (APTT): Lab: 110 s — ABNORMAL HIGH (ref 20.0–36.0)

## 2017-05-05 LAB — BASIC METABOLIC PANEL: Lab: 136 MMOL/L — ABNORMAL LOW (ref 137–147)

## 2017-05-05 MED ORDER — LISINOPRIL 20 MG PO TAB
20 mg | ORAL_TABLET | Freq: Every day | ORAL | 3 refills | Status: CN
Start: 2017-05-05 — End: ?

## 2017-05-05 MED ORDER — ONDANSETRON HCL 4 MG PO TAB
4 mg | ORAL | 0 refills | Status: DC | PRN
Start: 2017-05-05 — End: 2017-05-05
  Administered 2017-05-05: 14:00:00 4 mg via ORAL

## 2017-05-05 MED ORDER — APIXABAN 2.5 MG PO TAB
2.5 mg | Freq: Two times a day (BID) | ORAL | 0 refills | Status: DC
Start: 2017-05-05 — End: 2017-05-05
  Administered 2017-05-05: 16:00:00 2.5 mg via ORAL

## 2017-05-05 MED ORDER — SERTRALINE 50 MG PO TAB
50 mg | ORAL_TABLET | Freq: Every day | ORAL | 3 refills | Status: AC
Start: 2017-05-05 — End: ?

## 2017-05-05 MED ORDER — TAMSULOSIN 0.4 MG PO CAP
0.4 mg | Freq: Every day | ORAL | 0 refills | Status: DC
Start: 2017-05-05 — End: 2017-05-05
  Administered 2017-05-05: 16:00:00 0.4 mg via ORAL

## 2017-05-05 MED ORDER — AMLODIPINE 10 MG PO TAB
10 mg | ORAL_TABLET | Freq: Every day | ORAL | 3 refills | Status: AC
Start: 2017-05-05 — End: 2017-06-03

## 2017-05-05 MED ORDER — GADOBENATE DIMEGLUMINE 529 MG/ML (0.1MMOL/0.2ML) IV SOLN
9 mL | Freq: Once | INTRAVENOUS | 0 refills | Status: CP
Start: 2017-05-05 — End: ?
  Administered 2017-05-05: 12:00:00 9 mL via INTRAVENOUS

## 2017-05-05 NOTE — Progress Notes
Justin Hunter discharged on 05/05/2017.   Marland Kitchen  Discharge instructions reviewed with patient.  Valuables returned:    .  Home medications:    .  Functional assessment at discharge complete: Yes .

## 2017-05-05 NOTE — Progress Notes
PHYSICAL THERAPY  PROGRESS NOTE       MOBILITY:  Mobility  Progressive Mobility Level: Active transfer to chair  Level of Assistance: Assist X2  Assistive Device: Lift - total body  Time Tolerated: 0-10 minutes  Activity Limited By: No limitations    SUBJECTIVE:  Subjective  Significant hospital events: History of PVD. He underwent a LLE bypass at [facility]SH 9/17. Procedure was complicated by need for thrombolysis post-op. 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 He has been at rehab approx 2 days. He was sent to [facility] ED due to rising creatinine. He has had discoloration of his toes on his left foot for some time and vascular surgery was asked to evaluate. No acute intervention per Vascular.   Mental / Cognitive Status: Alert;Cooperative;Follows Commands  Persons Present: Spouse;RehabTechnician  Pain: Patient has no complaint of pain  Pain Interventions: Patient agrees to participate in therapy;Patient assisted into position of comfort  Comments: Patient was independent prior to surgery. Patient reports at rehab they had been transferring him via hoyer to an electric w/c and he had done some leg exercises.  Type of Home: Mobile Home (6 steps into home)       BED MOBILITY/TRANSFERS:  Bed Mobility/Transfers  Bed Mobility: Supine to Sit: Moderate Assist;x2 People;Verbal Cues;Use of Rail;Assist with B LE;Assist with Trunk  Transfer Type: Sliding Board  Transfer: Assistance Level: From;Bed;To;Wheelchair;Moderate Assist;x2 People  Transfer: Assistive Device: Sliding Board  Transfers: Type Of Assistance: For Safety Considerations  Other Transfer Type: Squat Pivot  Other Transfer: Assistance Level: From;Wheelchair;To;Bed Side Chair;Maximal Assist;x2 People  Other Transfer: Assistive Device: Doctor, general practice  Other Transfer: Type Of Assistance: Verbal Cues;For Balance;For Strength Deficit;Requires Extra Time;For Safety Considerations End Of Activity Status: Up in Chair;Nursing Notified;Instructed Patient to Request Assist with Mobility;Instructed Patient to Use Call Light (placed in recliner with TABS and hoyer sling in place)  Comments: practiced repeated slide board transfers for transfer practice. patient able to complete in 2 scoots with each attempt.     GAIT:  Non-ambulatory currently       WHEELCHAIR MOBILITY:  Engineer, drilling: Distance: 100 feet  Wheelchair: Assistance Level: Minimal Assist  Wheelchair: Method: Psychologist, occupational with;Bilateral;UE    ACTIVITY/EXERCISE:  Activity / Exercise  Sit Edge Of Bed: 15 minutes  Sit Edge Of Bed Assist:  (initially moderate assist then transitions to standby assist)       ASSESSMENT/PROGRESS:  Assessment/Progress  Impaired Mobility Due To: Decreased Strength;Post Surgical Changes;Impaired Balance  Assessment/Progress: Should Improve w/ Continued PT  Comments: attempted slide board transfers today- successful. patient able to follow commands and perform transfers well. anticipate continued need of intensive rehab for further mobility training.     AM-PAC 6 Clicks Basic Mobility Inpatient  Turning from your back to your side while in a flat bed without using bed rails: A lot  Moving from lying on your back to sitting on the side of a flatbed without using bedrails : A Lot  Moving to and from a bed to a chair (including a wheelchair): Total  Standing up from a chair using your arms (e.g. wheelchair, or bedside chair): Total  To walk in hospital room: Total  Climbing 3-5 steps with a railing: Total  Raw Score: 8  Standardized (T-scale) Score: 22.61  Basic Mobility CMS 0-100%: 85.35  CMS G Code Modifier for Basic Mobility: CM    GOALS:  Goals  Goal Formulation: With Patient/Family  Time  For Goal Achievement: 7 days  Pt Will Go Supine To/From Sit: w/ Minimal Assist  Pt Will Transfer Bed/Chair: w/ Moderate Assist  Pt Will Transfer Sit to Stand: w/ Maximum Assist Pt Will Achieve Sitting Balance: Standby Assist  Patientt Will Sit Edge Of Bed: 11-15 Minutes, w/ Stand By Assist    PLAN:  Plan   Treatment Interventions: Mobility Training;Strengthening;Balance Activities  Plan Frequency: 5-7 Days per Week  PT Plan for Next Visit: slide board transfers, squat pivot transfers, wheelchair mobility, increase time out of bed.     RECOMMENDATIONS:  PT Discharge Recommendations  PT Discharge Recommendations: Inpatient Setting;Recommend Physical Medicine and Rehabilitation Consult to address most appropriate level of rehabilitation placement  Equipment Recommendations: Too early to be determined (wheelchair and slide board likely)  Comments: patient plans to discharge back to [facility] today    Therapist: Joice Lofts Ieesha Abbasi  Date: 05/05/2017

## 2017-05-05 NOTE — Patient Education
Medication Education    Justin Hunter accepted counseling and was receptive.  he verbalized understanding.    The following medications were discussed:  senna    Where indicated, the patient was provided with additional medication and/or disease-state information.  All patient questions were answered and patient acknowledged understanding of the medications, side effects and other pertinent medication information.    Patient educated on plan of care, I&O-straight cath, pain management, d/c planning    Follow up should occur as needed.    Continue to address: indications    Saintclair Halsted

## 2017-05-05 NOTE — Case Management (ED)
Case Management Progress Note    NAME:Justin Hunter                          MRN: [id number]              DOB:03-19-1933          AGE: 83 y.o.  ADMISSION DATE: 04/30/2017             DAYS ADMITTED: LOS: 5 days      Todays Date: 05/05/2017    Plan  -Covering RNCM delivered 30 day free Eliquis card to patient in his room.    Interventions  ? Support      ? Info or Referral      ? Discharge Planning   Discharge Planning: Inpatient Rehabilitation  ? Medication Needs      ? Financial      ? Legal      ? Other        Disposition  ? Expected Discharge Date    Expected Discharge Date: 05/04/17  ? Transportation   Does the patient need discharge transport arranged?: Yes  Transportation Name, Phone and Availability #1: Facility transport   ? Next Level of Care (Acute Psych discharges only)      ? Discharge Disposition                Durable Medical Equipment     No service has been selected for the patient.      [place] Destination - Selection Complete     Service Request Status Selected Specialties Address Phone Number Fax Number    [facility] Selected Inpatient Rehabilitation Facility [address] [place], [place] [address] [phone]      [facility]     No service has been selected for the patient.      [facility]     No service has been selected for the patient.          Tristan Schroeder, CCM  Integrated Case Manager  *[phone]

## 2017-05-05 NOTE — Case Management (ED)
CMA Note:       This writer printed and placed transfer packet in pt's wallaroo.    Wrenn Willcox  Case Management Assistant

## 2017-05-05 NOTE — Progress Notes
Patient off unit to MRI

## 2017-05-05 NOTE — Patient Education
Medication Education    Nicodemus Denk accepted counseling and was engaged.  he verbalized understanding.    The following medications were discussed:  Eliquis, Norvasc, Aspirin, Lipitor, Colace, Metoprolol, Zofran, Miralax, zoloft, flomax, zinc      EDUCATION TEACHING:   Straight cath   D/C   Plan of care  Turns/skin  Foot drop/q2 boot change      Where indicated, the patient was provided with additional medication and/or disease-state information.  All patient questions were answered and patient acknowledged understanding of the medications, side effects and other pertinent medication information.    Follow up should occur as needed.    Continue to address: indications    Jen Mow

## 2017-05-05 NOTE — Progress Notes
OCCUPATIONAL THERAPY  PROGRESS NOTE    Patient Name: Justin Hunter                   Room/Bed: UK0254/27  Admitting Diagnosis:       Subjective  Precautions: Falls, SCI  Pain / Complaints: Patient agrees to participate in therapy;Patient has no c/o pain  Comments: Patient just got up to the chair with PT prior to arrival.    Objective  Psychosocial Status: Willing and Cooperative to Participate  Persons Present: Spouse    Home Living  Type of Home: Mobile Home (6 steps into home)  Home Layout: Able to Live on Main Level w/Bedrm/Bathrm Acupuncturist / Tub: Psychologist, counselling    Prior Function  Level Of Independence: Independent with ADLs and functional transfers  Other Function Comments: pt was at [facility] prior to admit and wanting to get back there    ADL's  Where Assessed: Chair  Grooming Assist:  Patient has WFL BUE to complete but wise enjoys assisting patient  LE Dressing Assist: Maximum Assist  LE Dressing Deficits: Don/Doff R Sock;Don/Doff L Sock  Comment: Discussed lower body dressing techniques at bed level using reacher. Patient owns a Sports administrator at home but unable to obtain and bring to rehab due to living 70 miles away. Since patient up in the chair, reviewed donn/doffing socks using gait belt to lift leg up and cross over opposite leg. Patient required max assist but nearing moderate assist. Patient will progress quickly with this task with ongoing practice. Once leg crossed over opposite leg, patient stabilizes leg with hand and donn/doffs sock with other. Patient used gait belt to lower leg to the floor and position correctly.     Cognition  Overall Cognitive Status: WFL to Adequately Complete Self Care Tasks Safely    UE Strength / Tone  Comment: Patient completes 3 sets BUE exercises.      AM-PAC 6 Clicks Daily Activity Inpatient  Putting on and taking off regular lower body clothes?: Total  Bathing (Including washing, rinsing, drying): A Lot Toileting, which includes using toilet, bedpan, or urinal: Total  Putting on and taking off regular upper body clothing: A Little  Taking care of personal grooming such as brushing teeth: A Little  Eating meals?: A Little  Daily Activity Raw Score: 13  Standardized (t-scale) score: 32.03  CMS 0-100% Score: 63.03  CMS G Code Modifier: CL         ADL Goals  Patient Will Perform Grooming: at [facility] of Bed;w/ Stand By Assist  Patient Will Perform UE Dressing: At [facility] of Bed;In Chair;w/ Stand By Assist  Patient Will Perform LE Dressing: w/ Moderate Assist;At Edge of Bed;Supine  Patient Will Perform Toileting: w/ Bedside Commode;w/ Moderate Assist    Functional Transfer Goals  Pt Will Perform All Functional Transfers: Moderate Assist, w/ Assistive Devices, w/ Good Judgment/Safety    Arm Goals  Pt  Will Complete Theraband Exer: B UE, 1 Set, 10 Reps, 15 Reps, Level 3 Theraband         OT Discharge Recommendations  OT Discharge Recommendations: Inpatient Setting, To address deficits, maximize function and improve safety.  Recommend ongoing assistance for: In and out of house, Transfers, Bed mobility, Stairs, Meal preparation, Safety concerns        Therapist: Theotis Burrow, [place] [id number]  Date: 05/05/2017

## 2017-05-06 LAB — CULTURE-WOUND/TISSUE/FLUID(AEROBIC ONLY)W/SENSITIVITY

## 2017-05-07 LAB — CULTURE-BLOOD W/SENSITIVITY

## 2017-05-09 LAB — COMPREHENSIVE METABOLIC PANEL
Lab: 134 — ABNORMAL LOW (ref 4.14–5.80)
Lab: 27 — ABNORMAL LOW (ref >59)
Lab: 31 — ABNORMAL LOW (ref >59)
Lab: 34
Lab: 50 — ABNORMAL HIGH (ref 0–44)

## 2017-05-15 ENCOUNTER — Encounter: Admit: 2017-05-15 | Discharge: 2017-05-15 | Payer: MEDICARE

## 2017-05-15 DIAGNOSIS — R9431 Abnormal electrocardiogram [ECG] [EKG]: Principal | ICD-10-CM

## 2017-05-15 LAB — BASIC METABOLIC PANEL
Lab: 113 — ABNORMAL HIGH (ref 65–99)
Lab: 136 — ABNORMAL LOW (ref 13.0–17.7)
Lab: 2.4 — ABNORMAL HIGH (ref 0.76–1.27)
Lab: 23 — ABNORMAL HIGH (ref 8–27)
Lab: 24 — ABNORMAL LOW (ref >59)
Lab: 28 — ABNORMAL LOW (ref >59)
Lab: 4 — ABNORMAL LOW (ref 96–106)
Lab: 7.8 — ABNORMAL LOW (ref 8.6–10.2)
Lab: 99

## 2017-05-15 LAB — CBC: Lab: 7

## 2017-05-20 ENCOUNTER — Encounter: Admit: 2017-05-20 | Discharge: 2017-05-20 | Payer: MEDICARE

## 2017-05-20 DIAGNOSIS — I739 Peripheral vascular disease, unspecified: ICD-10-CM

## 2017-05-20 DIAGNOSIS — I82409 Acute embolism and thrombosis of unspecified deep veins of unspecified lower extremity: ICD-10-CM

## 2017-05-20 DIAGNOSIS — E785 Hyperlipidemia, unspecified: Principal | ICD-10-CM

## 2017-05-20 DIAGNOSIS — Z9889 Other specified postprocedural states: ICD-10-CM

## 2017-05-20 NOTE — Progress Notes
Records Request    Medical records request for continuation of care:    Patient has appointment on 06/03/17   with  Dr. Trudi Ida* .    Please fax records to [facility]  [phone]    Request records:    Most recent hospital admission/ Emergency Room Visit    Stroke related records- September 2018    EKG's           Recent Cardiac Testing    Any cardiac-related records    Recent Labs    Procedures    H&P/Discharge Summary    Operative Reports- Cardiac        Thank you,      [facility]  [facility]  [address]  [place], [place] [address]  Phone:  [phone]  Fax:  [phone]

## 2017-06-03 ENCOUNTER — Ambulatory Visit: Admit: 2017-06-03 | Discharge: 2017-06-04 | Payer: MEDICARE

## 2017-06-03 ENCOUNTER — Encounter: Admit: 2017-06-03 | Discharge: 2017-06-03 | Payer: MEDICARE

## 2017-06-03 DIAGNOSIS — I2510 Atherosclerotic heart disease of native coronary artery without angina pectoris: Principal | ICD-10-CM

## 2017-06-03 DIAGNOSIS — Z9889 Other specified postprocedural states: ICD-10-CM

## 2017-06-03 DIAGNOSIS — I82409 Acute embolism and thrombosis of unspecified deep veins of unspecified lower extremity: ICD-10-CM

## 2017-06-03 DIAGNOSIS — E785 Hyperlipidemia, unspecified: Principal | ICD-10-CM

## 2017-06-03 DIAGNOSIS — I959 Hypotension, unspecified: ICD-10-CM

## 2017-06-03 DIAGNOSIS — I739 Peripheral vascular disease, unspecified: ICD-10-CM

## 2017-06-03 DIAGNOSIS — G8222 Paraplegia, incomplete: ICD-10-CM

## 2017-06-03 DIAGNOSIS — I96 Gangrene, not elsewhere classified: ICD-10-CM

## 2017-06-05 ENCOUNTER — Encounter: Admit: 2017-06-05 | Discharge: 2017-06-05 | Payer: MEDICARE

## 2017-10-03 DEATH — deceased

## 2018-07-21 IMAGING — CT Head^_WITHOUT_CONTRAST (Adult)
1 series · 15 of 28 positions shown, 19 images · non-contrast
Comparison: none

[Series 2: brain w/o 4.8 brain · axial · non-contrast · 0.55mm/px · z∈[+120,+253]mm · 15 of 28 slices shown, 19 images]
[im 2/28  brain]
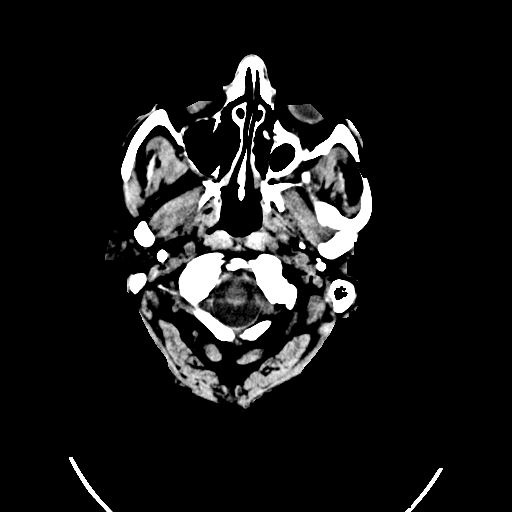
[im 2/28  bone]
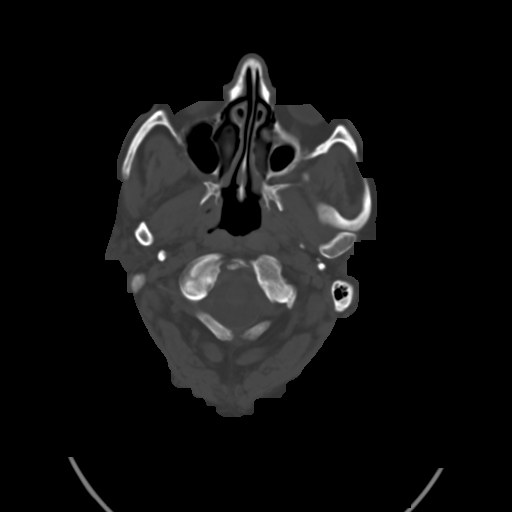
[im 4/28  brain]
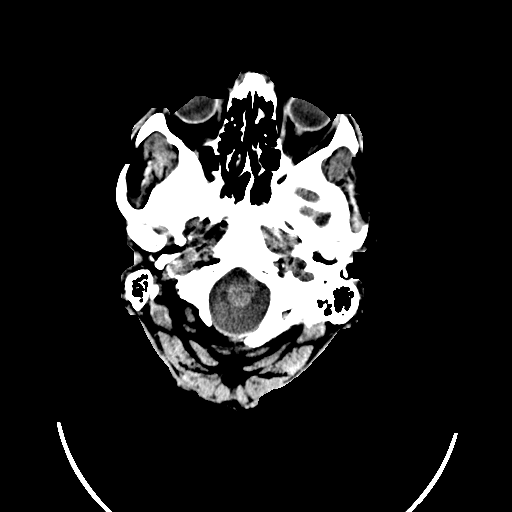
[im 6/28  brain]
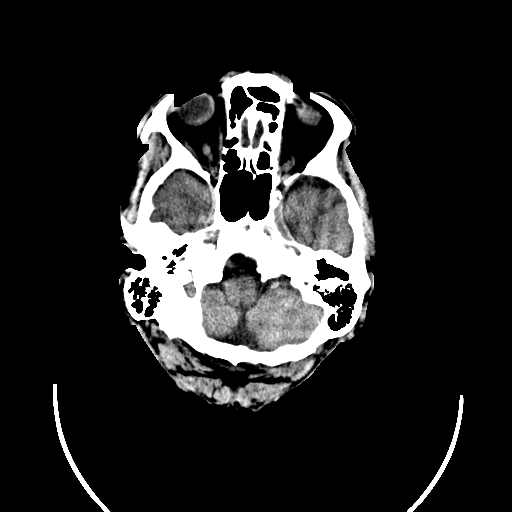
[im 8/28  brain]
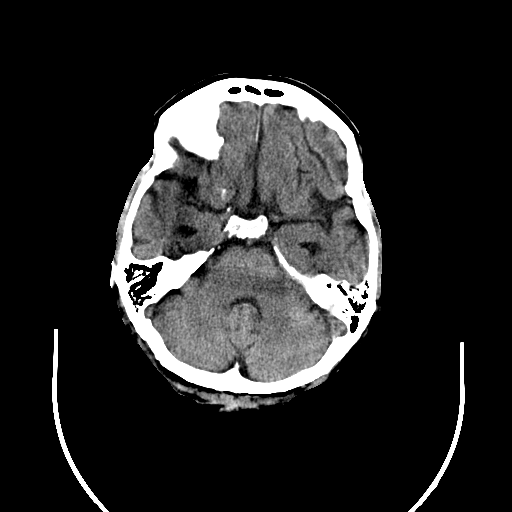
[im 9/28  brain]
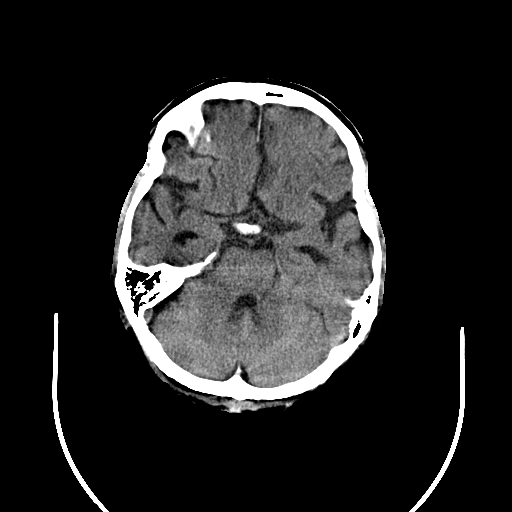
[im 9/28  bone]
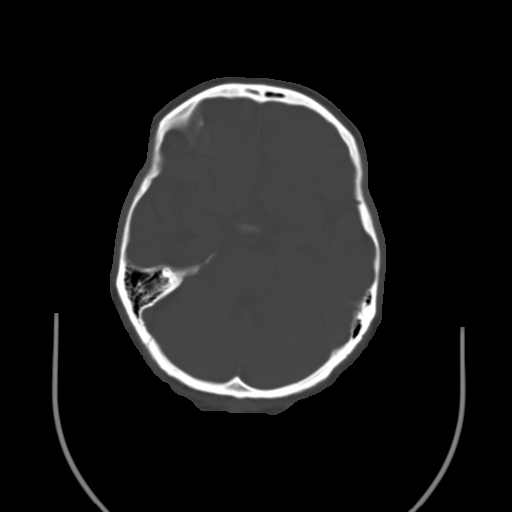
[im 11/28  brain]
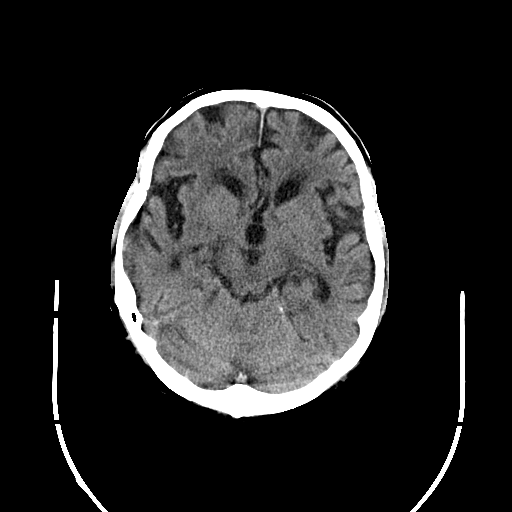
[im 13/28  brain]
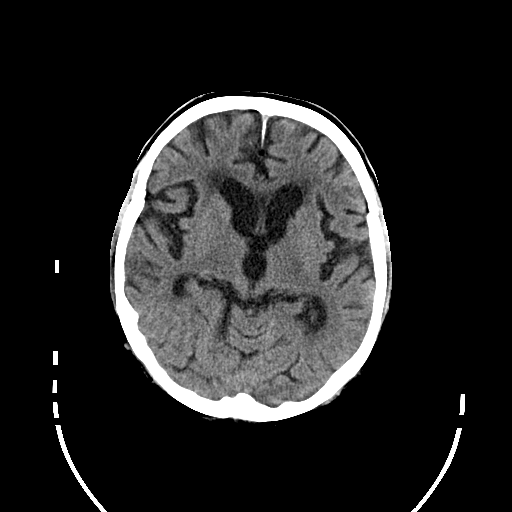
[im 15/28  brain]
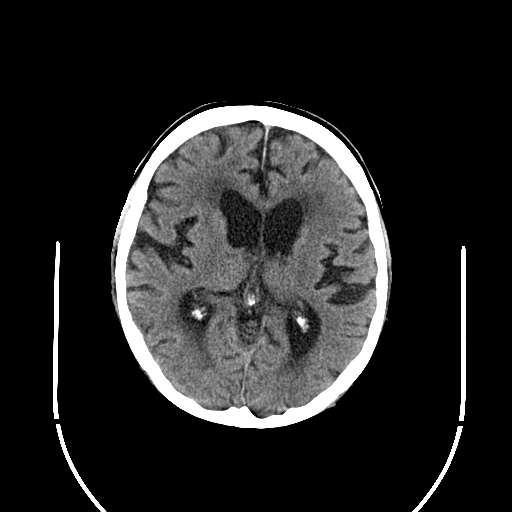
[im 16/28  brain]
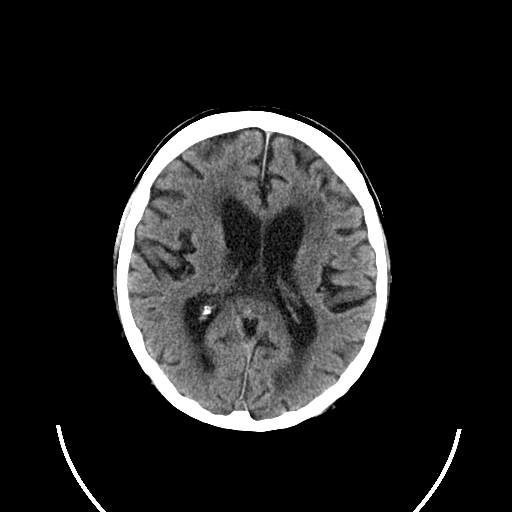
[im 16/28  bone]
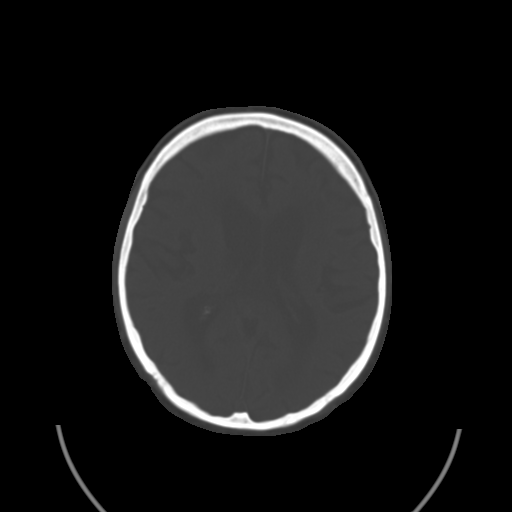
[im 18/28  brain]
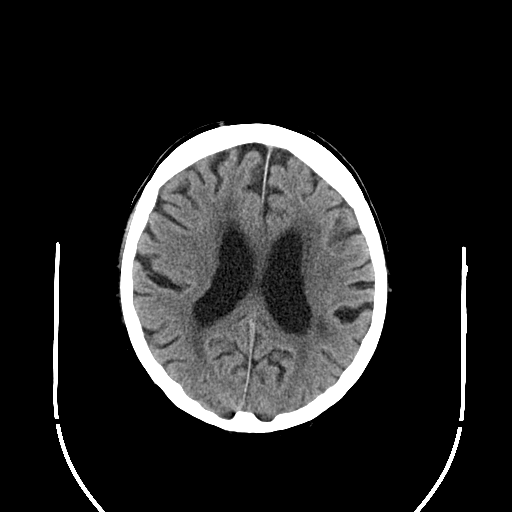
[im 20/28  brain]
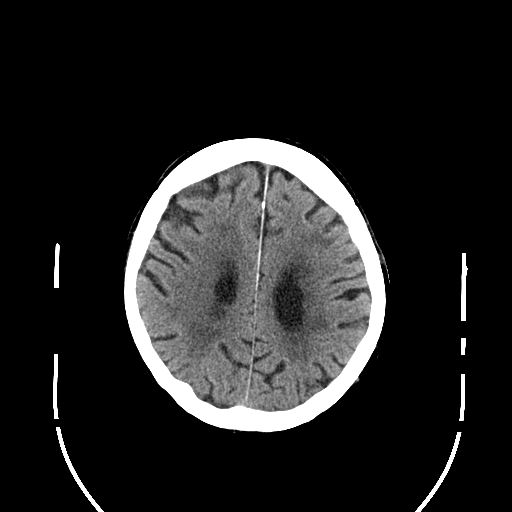
[im 21/28  brain]
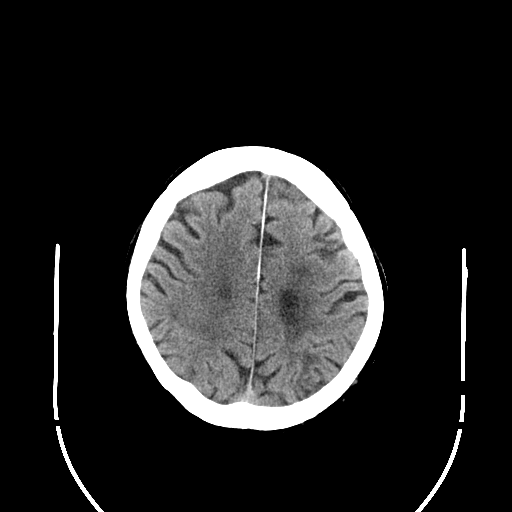
[im 23/28  brain]
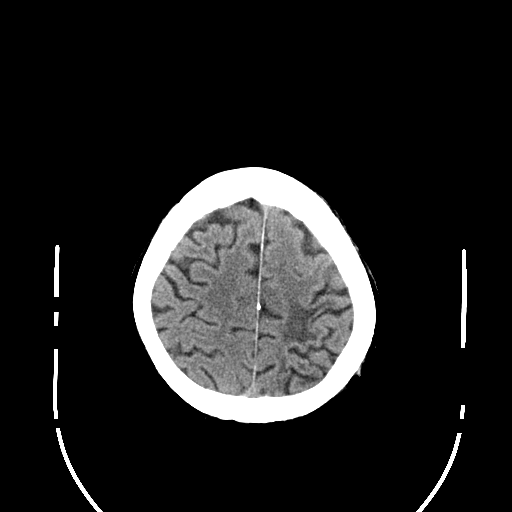
[im 23/28  bone]
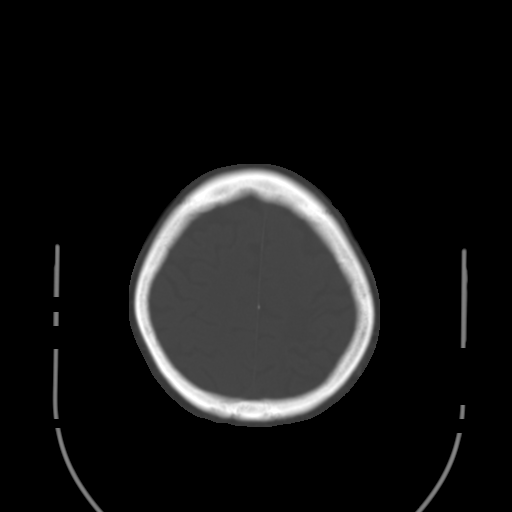
[im 25/28  brain]
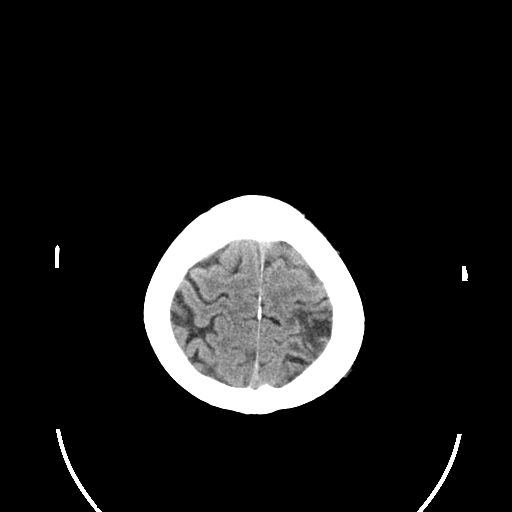
[im 27/28  brain]
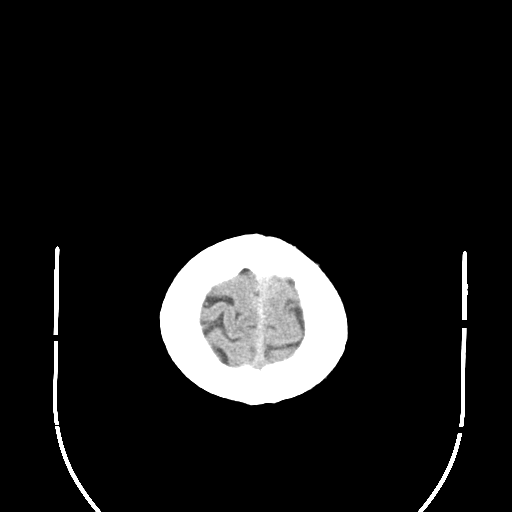

[15 of 28 positions shown; findings below may reference images not displayed]

DIAGNOSTIC STUDIES

EXAM

CT scan of the head without contrast.

INDICATION

mental status change
Mental status changes the past few days, right arm "drooping"
BG/TJ

TECHNIQUE

All CT scans at this facility use dose modulation, iterative reconstruction, and/or weight based
dosing when appropriate to reduce radiation dose to as low as reasonably achievable.

COMPARISONS

None available

FINDINGS

Encephalomalacia changes are noted in the left parietal region consistent with old infarct.
Underlying extensive small vessel ischemic changes seen. There is cortical atrophy with
proportional large with the ventricular system. There is no intracranial mass or or hemorrhage
seen. Cerumen can be identified within the left external auditory canal. Calcification of the
cavernous carotid arteries is evident. The bony calvarium is within normal limits.

IMPRESSION

Extensive cortical atrophy as described with underlying small vessel ischemic disease.

Encephalomalacia changes from prior infarct in the left parietal region.

No intracranial hemorrhage or mass effect.
